# Patient Record
Sex: Female | Born: 2000 | Race: White | Hispanic: No | Marital: Single | Smoking: Never smoker
Health system: Southern US, Community
[De-identification: ages and names within clinical notes are randomized; demographics above are authoritative.]

## PROBLEM LIST (undated history)

## (undated) DIAGNOSIS — K219 Gastro-esophageal reflux disease without esophagitis: Secondary | ICD-10-CM

## (undated) DIAGNOSIS — Z789 Other specified health status: Secondary | ICD-10-CM

## (undated) HISTORY — DX: Other specified health status: Z78.9

## (undated) HISTORY — PX: KNEE SURGERY: SHX244

## (undated) HISTORY — PX: TONSILLECTOMY: SUR1361

---

## 2020-10-03 NOTE — L&D Delivery Note (Addendum)
Delivery Note:   G1P0 at [redacted]w[redacted]d  Admitting diagnosis: Labor and delivery indication for care or intervention [O75.9] Risks: GBS positive  Onset of labor: 0330 IOL/Augmentation: AROM ROM: 0330  Complete dilation at 09/25/2021  0524 Onset of pushing at 0529 FHR second stage 135  Analgesia /Anesthesia intrapartum:Epidural  Pushing in lithotomy position with CNM and L&D staff support, significant other Nasir and his mom present for birth and supportive. 2 contraction 2nd stage.  Delivery of a Live born female  Birth Weight:  3260g APGAR: 8, 9  Newborn Delivery   Birth date/time: 09/25/2021 05:33:00 Delivery type: Vaginal, Spontaneous      in cephalic presentation, position ROA. Gentle traction applied and after head delivered tight nuchal noted and not reducible. Shoulders delivered with ease. Delivered through nuchal and baby placed onto mom's chest.   APGAR:1 min-8 , 5 min-9   Nuchal Cord: Yes  Cord double clamped after cessation of pulsation, cut by father, Nasir.  Collection of cord blood for typing completed. Cord blood donation-None  Arterial cord blood sample-No    Placenta delivered-Spontaneous  with 3 vessels . Uterotonics: IV Pitocin Placenta to L&D Uterine tone firm  Bleeding light  None  laceration identified.  Episiotomy:None  Local analgesia: N/A  Repair: N/A Est. Blood Loss (mL):300.00   Complications: None  Mom to postpartum.  Ellender Hose to Couplet care / Skin to Skin.  Delivery Report:  Review the Delivery Report for details.    Trellis Moment, SNM 09/25/2021, 5:48 AM   The above was performed under my direct supervision and guidance.

## 2021-04-07 ENCOUNTER — Other Ambulatory Visit: Payer: Self-pay | Admitting: Obstetrics & Gynecology

## 2021-04-07 DIAGNOSIS — O3680X0 Pregnancy with inconclusive fetal viability, not applicable or unspecified: Secondary | ICD-10-CM

## 2021-04-08 ENCOUNTER — Other Ambulatory Visit: Payer: Self-pay

## 2021-04-08 ENCOUNTER — Other Ambulatory Visit: Payer: Self-pay | Admitting: Obstetrics & Gynecology

## 2021-04-08 ENCOUNTER — Ambulatory Visit (INDEPENDENT_AMBULATORY_CARE_PROVIDER_SITE_OTHER): Payer: Medicaid Other

## 2021-04-08 DIAGNOSIS — O3680X0 Pregnancy with inconclusive fetal viability, not applicable or unspecified: Secondary | ICD-10-CM

## 2021-04-08 DIAGNOSIS — Z3A15 15 weeks gestation of pregnancy: Secondary | ICD-10-CM | POA: Diagnosis not present

## 2021-04-08 NOTE — Progress Notes (Signed)
[place] 15+2 wks,single IUP,normal ovaries,anterior placenta gr 0,FHR 160 bpm,SVP of fluid 2.9 cm,efw 106 g 12.6%

## 2021-04-21 DIAGNOSIS — Z3400 Encounter for supervision of normal first pregnancy, unspecified trimester: Secondary | ICD-10-CM | POA: Insufficient documentation

## 2021-05-04 ENCOUNTER — Other Ambulatory Visit: Payer: Self-pay | Admitting: Advanced Practice Midwife

## 2021-05-04 DIAGNOSIS — Z363 Encounter for antenatal screening for malformations: Secondary | ICD-10-CM

## 2021-05-05 ENCOUNTER — Other Ambulatory Visit: Payer: Self-pay

## 2021-05-05 ENCOUNTER — Ambulatory Visit (INDEPENDENT_AMBULATORY_CARE_PROVIDER_SITE_OTHER): Payer: Medicaid Other | Admitting: Advanced Practice Midwife

## 2021-05-05 ENCOUNTER — Ambulatory Visit (INDEPENDENT_AMBULATORY_CARE_PROVIDER_SITE_OTHER): Payer: Medicaid Other

## 2021-05-05 ENCOUNTER — Other Ambulatory Visit (HOSPITAL_COMMUNITY)
Admission: RE | Admit: 2021-05-05 | Discharge: 2021-05-05 | Disposition: A | Discharge status: Home / Self Care | Payer: Medicaid Other | Source: Ambulatory Visit | Attending: Advanced Practice Midwife | Admitting: Advanced Practice Midwife

## 2021-05-05 ENCOUNTER — Encounter: Payer: Self-pay | Admitting: Advanced Practice Midwife

## 2021-05-05 VITALS — BP 108/6 | HR 89 | Ht 66.0 in | Wt 134.0 lb

## 2021-05-05 DIAGNOSIS — Z363 Encounter for antenatal screening for malformations: Secondary | ICD-10-CM

## 2021-05-05 DIAGNOSIS — Z3A19 19 weeks gestation of pregnancy: Secondary | ICD-10-CM

## 2021-05-05 DIAGNOSIS — Z3402 Encounter for supervision of normal first pregnancy, second trimester: Secondary | ICD-10-CM

## 2021-05-05 DIAGNOSIS — O0930 Supervision of pregnancy with insufficient antenatal care, unspecified trimester: Secondary | ICD-10-CM

## 2021-05-05 LAB — POCT URINALYSIS DIPSTICK OB
Blood, UA: NEGATIVE
Glucose, UA: NEGATIVE
Ketones, UA: NEGATIVE
Leukocytes, UA: NEGATIVE
Nitrite, UA: NEGATIVE
POC,PROTEIN,UA: NEGATIVE

## 2021-05-05 MED ORDER — BLOOD PRESSURE MONITOR MISC: 0 refills | Status: DC

## 2021-05-05 NOTE — Patient Instructions (Signed)
Meghan Mitchell, thank you for choosing our office today! We appreciate the opportunity to meet your healthcare needs. You may receive a short survey by mail, e-mail, or through [employer]. If you are happy with your care we would appreciate if you could take just a few minutes to complete the survey questions. We read all of your comments and take your feedback very seriously. Thank you again for choosing our office.  Center for [employer] [facility] Team at [facility]  [facility] at [facility] ([address] [place], [place] [address]) Entrance C, located off of [address] Free 24/7 valet parking   Nausea & Vomiting Have saltine crackers or pretzels by your bed and eat a few bites before you raise your head out of bed in the morning Eat small frequent meals throughout the day instead of large meals Drink plenty of fluids throughout the day to stay hydrated, just don't drink a lot of fluids with your meals.  This can make your stomach fill up faster making you feel sick Do not brush your teeth right after you eat Products with real ginger are good for nausea, like ginger ale and ginger hard candy Make sure it says made with real ginger! Sucking on sour candy like lemon heads is also good for nausea If your prenatal vitamins make you nauseated, take them at night so you will sleep through the nausea Sea Bands If you feel like you need medicine for the nausea & vomiting please let us know If you are unable to keep any fluids or food down please let us know   Constipation Drink plenty of fluid, preferably water, throughout the day Eat foods high in fiber such as fruits, vegetables, and grains Exercise, such as walking, is a good way to keep your bowels regular Drink warm fluids, especially warm prune juice, or decaf coffee Eat a 1/2 cup of real oatmeal (not instant), 1/2 cup applesauce, and 1/2-1 cup warm prune juice every day If needed, you may take Colace (docusate sodium) stool softener  once or twice a day to help keep the stool soft.  If you still are having problems with constipation, you may take Miralax once daily as needed to help keep your bowels regular.   Home Blood Pressure Monitoring for Patients   Your provider has recommended that you check your blood pressure (BP) at least once a week at home. If you do not have a blood pressure cuff at home, one will be provided for you. Contact your provider if you have not received your monitor within 1 week.   Helpful Tips for Accurate Home Blood Pressure Checks  Don't smoke, exercise, or drink caffeine 30 minutes before checking your BP Use the restroom before checking your BP (a full bladder can raise your pressure) Relax in a comfortable upright chair Feet on the ground Left arm resting comfortably on a flat surface at the level of your heart Legs uncrossed Back supported Sit quietly and don't talk Place the cuff on your bare arm Adjust snuggly, so that only two fingertips can fit between your skin and the top of the cuff Check 2 readings separated by at least one minute Keep a log of your BP readings For a visual, please reference this diagram: http://ccnc.care/bpdiagram  Provider Name: [facility]     Phone: [phone]  Zone 1: ALL CLEAR  Continue to monitor your symptoms:  BP reading is less than 140 (top number) or less than 90 (bottom  number)  No right upper stomach pain No headaches or seeing spots No feeling nauseated or throwing up No swelling in face and hands  Zone 2: CAUTION Call your doctor's office for any of the following:  BP reading is greater than 140 (top number) or greater than 90 (bottom number)  Stomach pain under your ribs in the middle or right side Headaches or seeing spots Feeling nauseated or throwing up Swelling in face and hands  Zone 3: EMERGENCY  Seek immediate medical care if you have any of the following:  BP reading is greater than160 (top number) or greater than  110 (bottom number) Severe headaches not improving with Tylenol Serious difficulty catching your breath Any worsening symptoms from Zone 2    First Trimester of Pregnancy The first trimester of pregnancy is from week 1 until the end of week 12 (months 1 through 3). A week after a sperm fertilizes an egg, the egg will implant on the wall of the uterus. This embryo will begin to develop into a baby. Genes from you and your partner are forming the baby. The female genes determine whether the baby is a boy or a girl. At 6-8 weeks, the eyes and face are formed, and the heartbeat can be seen on ultrasound. At the end of 12 weeks, all the baby's organs are formed.  Now that you are pregnant, you will want to do everything you can to have a healthy baby. Two of the most important things are to get good prenatal care and to follow your health care provider's instructions. Prenatal care is all the medical care you receive before the baby's birth. This care will help prevent, find, and treat any problems during the pregnancy and childbirth. BODY CHANGES Your body goes through many changes during pregnancy. The changes vary from woman to woman.  You may gain or lose a couple of pounds at first. You may feel sick to your stomach (nauseous) and throw up (vomit). If the vomiting is uncontrollable, call your health care provider. You may tire easily. You may develop headaches that can be relieved by medicines approved by your health care provider. You may urinate more often. Painful urination may mean you have a bladder infection. You may develop heartburn as a result of your pregnancy. You may develop constipation because certain hormones are causing the muscles that push waste through your intestines to slow down. You may develop hemorrhoids or swollen, bulging veins (varicose veins). Your breasts may begin to grow larger and become tender. Your nipples may stick out more, and the tissue that surrounds them  (areola) may become darker. Your gums may bleed and may be sensitive to brushing and flossing. Dark spots or blotches (chloasma, mask of pregnancy) may develop on your face. This will likely fade after the baby is born. Your menstrual periods will stop. You may have a loss of appetite. You may develop cravings for certain kinds of food. You may have changes in your emotions from day to day, such as being excited to be pregnant or being concerned that something may go wrong with the pregnancy and baby. You may have more vivid and strange dreams. You may have changes in your hair. These can include thickening of your hair, rapid growth, and changes in texture. Some women also have hair loss during or after pregnancy, or hair that feels dry or thin. Your hair will most likely return to normal after your baby is born. WHAT TO EXPECT AT YOUR PRENATAL  VISITS During a routine prenatal visit: You will be weighed to make sure you and the baby are growing normally. Your blood pressure will be taken. Your abdomen will be measured to track your baby's growth. The fetal heartbeat will be listened to starting around week 10 or 12 of your pregnancy. Test results from any previous visits will be discussed. Your health care provider may ask you: How you are feeling. If you are feeling the baby move. If you have had any abnormal symptoms, such as leaking fluid, bleeding, severe headaches, or abdominal cramping. If you have any questions. Other tests that may be performed during your first trimester include: Blood tests to find your blood type and to check for the presence of any previous infections. They will also be used to check for low iron levels (anemia) and Rh antibodies. Later in the pregnancy, blood tests for diabetes will be done along with other tests if problems develop. Urine tests to check for infections, diabetes, or protein in the urine. An ultrasound to confirm the proper growth and development  of the baby. An amniocentesis to check for possible genetic problems. Fetal screens for spina bifida and Down syndrome. You may need other tests to make sure you and the baby are doing well. HOME CARE INSTRUCTIONS  Medicines Follow your health care provider's instructions regarding medicine use. Specific medicines may be either safe or unsafe to take during pregnancy. Take your prenatal vitamins as directed. If you develop constipation, try taking a stool softener if your health care provider approves. Diet Eat regular, well-balanced meals. Choose a variety of foods, such as meat or vegetable-based protein, fish, milk and low-fat dairy products, vegetables, fruits, and whole grain breads and cereals. Your health care provider will help you determine the amount of weight gain that is right for you. Avoid raw meat and uncooked cheese. These carry germs that can cause birth defects in the baby. Eating four or five small meals rather than three large meals a day may help relieve nausea and vomiting. If you start to feel nauseous, eating a few soda crackers can be helpful. Drinking liquids between meals instead of during meals also seems to help nausea and vomiting. If you develop constipation, eat more high-fiber foods, such as fresh vegetables or fruit and whole grains. Drink enough fluids to keep your urine clear or pale yellow. Activity and Exercise Exercise only as directed by your health care provider. Exercising will help you: Control your weight. Stay in shape. Be prepared for labor and delivery. Experiencing pain or cramping in the lower abdomen or low back is a good sign that you should stop exercising. Check with your health care provider before continuing normal exercises. Try to avoid standing for long periods of time. Move your legs often if you must stand in one place for a long time. Avoid heavy lifting. Wear low-heeled shoes, and practice good posture. You may continue to have sex  unless your health care provider directs you otherwise. Relief of Pain or Discomfort Wear a good support bra for breast tenderness.   Take warm sitz baths to soothe any pain or discomfort caused by hemorrhoids. Use hemorrhoid cream if your health care provider approves.   Rest with your legs elevated if you have leg cramps or low back pain. If you develop varicose veins in your legs, wear support hose. Elevate your feet for 15 minutes, 3-4 times a day. Limit salt in your diet. Prenatal Care Schedule your prenatal visits by the  twelfth week of pregnancy. They are usually scheduled monthly at first, then more often in the last 2 months before delivery. Write down your questions. Take them to your prenatal visits. Keep all your prenatal visits as directed by your health care provider. Safety Wear your seat belt at all times when driving. Make a list of emergency phone numbers, including numbers for family, friends, the hospital, and police and fire departments. General Tips Ask your health care provider for a referral to a local prenatal education class. Begin classes no later than at the beginning of month 6 of your pregnancy. Ask for help if you have counseling or nutritional needs during pregnancy. Your health care provider can offer advice or refer you to specialists for help with various needs. Do not use hot tubs, steam rooms, or saunas. Do not douche or use tampons or scented sanitary pads. Do not cross your legs for long periods of time. Avoid cat litter boxes and soil used by cats. These carry germs that can cause birth defects in the baby and possibly loss of the fetus by miscarriage or stillbirth. Avoid all smoking, herbs, alcohol, and medicines not prescribed by your health care provider. Chemicals in these affect the formation and growth of the baby. Schedule a dentist appointment. At home, brush your teeth with a soft toothbrush and be gentle when you floss. SEEK MEDICAL CARE IF:   You have dizziness. You have mild pelvic cramps, pelvic pressure, or nagging pain in the abdominal area. You have persistent nausea, vomiting, or diarrhea. You have a bad smelling vaginal discharge. You have pain with urination. You notice increased swelling in your face, hands, legs, or ankles. SEEK IMMEDIATE MEDICAL CARE IF:  You have a fever. You are leaking fluid from your vagina. You have spotting or bleeding from your vagina. You have severe abdominal cramping or pain. You have rapid weight gain or loss. You vomit blood or material that looks like coffee grounds. You are exposed to [place] measles and have never had them. You are exposed to fifth disease or chickenpox. You develop a severe headache. You have shortness of breath. You have any kind of trauma, such as from a fall or a car accident. Document Released: 09/13/2001 Document Revised: 02/03/2014 Document Reviewed: 07/30/2013 [facility] Patient Information 2015 [place], [place]. This information is not intended to replace advice given to you by your health care provider. Make sure you discuss any questions you have with your health care provider.

## 2021-05-05 NOTE — Progress Notes (Signed)
[place] 19+1 wks,breech,cx 3.1 cm,anterior placenta gr 0,normal ovaries,svp of fluid 5.3 cm,fhr 154 bpm,EFW 244 g 16%,anatomy complete,no obvious abnormalities

## 2021-05-05 NOTE — Progress Notes (Signed)
INITIAL OBSTETRICAL VISIT Patient name: Meghan Mitchell MRN [id number]  Date of birth: 2001-03-11 Chief Complaint:   Initial Prenatal Visit  History of Present Illness:   Meghan Mitchell is a 20 y.o. G1P0 Caucasian female at [redacted]w[redacted]d by LMP c/w u/s at 15.2 weeks with an Estimated Date of Delivery: 09/28/21 being seen today for her initial obstetrical visit.   Patient's last menstrual period was 12/22/2020 (exact date). Her obstetrical history is significant for primigravida.   Today she reports no complaints.  Last pap never.   Depression screen [facility] 2/9 05/05/2021  Decreased Interest 0  Down, Depressed, Hopeless 0  PHQ - 2 Score 0  Altered sleeping 1  Tired, decreased energy 1  Change in appetite 1  Feeling bad or failure about yourself  0  Trouble concentrating 0  Moving slowly or fidgety/restless 0  Suicidal thoughts 0  PHQ-9 Score 3     GAD 7 : Generalized Anxiety Score 05/05/2021  Nervous, Anxious, on Edge 0  Control/stop worrying 0  Worry too much - different things 0  Trouble relaxing 0  Restless 0  Easily annoyed or irritable 0  Afraid - awful might happen 0  Total GAD 7 Score 0     Review of Systems:   Pertinent items are noted in HPI Denies cramping/contractions, leakage of fluid, vaginal bleeding, abnormal vaginal discharge w/ itching/odor/irritation, headaches, visual changes, shortness of breath, chest pain, abdominal pain, severe nausea/vomiting, or problems with urination or bowel movements unless otherwise stated above.  Pertinent History Reviewed:  Reviewed past medical,surgical, social, obstetrical and family history.  Reviewed problem list, medications and allergies. OB History  Gravida Para Term Preterm AB Living  1            SAB IAB Ectopic Multiple Live Births               # Outcome Date GA Lbr Len/2nd Weight Sex Delivery Anes PTL Lv  1 Current            Physical Assessment:   Vitals:   05/05/21 1056 05/05/21 1139  BP: (!) 108/6   Pulse:  89   Weight: 134 lb (60.8 kg)   Height:  5\' 6"  (1.676 m)  Body mass index is 21.63 kg/m.       Physical Examination:  General appearance - well appearing, and in no distress  Mental status - alert, oriented to person, place, and time  Psych:  She has a normal mood and affect  Skin - warm and dry, normal color, no suspicious lesions noted  Chest - effort normal, all lung fields clear to auscultation bilaterally  Heart - normal rate and regular rhythm  Abdomen - soft, nontender  Extremities:  No swelling or varicosities noted  Pelvic - VULVA: normal appearing vulva with no masses, tenderness or lesions  VAGINA: normal appearing vagina with normal color and discharge, no lesions  CERVIX: normal appearing cervix without discharge or lesions, no CMT  Thin prep pap is done without HR HPV cotesting  Chaperone:    TODAY'S ANATOMY U/S: Malachy Mood 19+1 wks,breech,cx 3.1 cm,anterior placenta gr 0,normal ovaries,svp of fluid 5.3 cm,fhr 154 bpm,EFW 244 g 16%,anatomy complete,no obvious abnormalities    No results found for this or any previous visit (from the past 24 hour(s)).  Assessment & Plan:  1) Low-Risk Pregnancy G1P0 at [redacted]w[redacted]d with an Estimated Date of Delivery: 09/28/21   2) Initial OB visit  3) Late onset of care at 19wks  Meds:  Meds ordered this encounter  Medications   Blood Pressure Monitor MISC    Sig: For regular home bp monitoring during pregnancy    Dispense:  1 each    Refill:  0    Z34.82 Please mail to patient    Initial labs obtained Continue prenatal vitamins Reviewed n/v relief measures and warning s/s to report Reviewed recommended weight gain based on pre-gravid BMI Encouraged well-balanced diet Genetic & carrier screening discussed: declines Panorama, AFP, and Horizon , too late for NT/IT Ultrasound discussed; fetal survey: requested CCNC completed> form faxed if has or is planning to apply for medicaid The nature of [employer] for [employer] with multiple MDs and other Advanced Practice Providers was explained to patient; also emphasized that fellows, residents, and students are part of our team. Does not have home bp cuff. Office bp cuff given: no. Rx sent: yes. Check bp weekly, let us know if consistently >140/90.   No indications for ASA therapy or early A1c (per uptodate)   Follow-up: Return in about 4 weeks (around 06/02/2021) for LROB, in person.   Orders Placed This Encounter  Procedures   Urine Culture   Pain Management Screening Profile (10S)   CBC/D/Plt+RPR+Rh+ABO+RubIgG...   Hgb Fractionation Cascade   POC Urinalysis Dipstick OB    Arabella Merles [facility] 05/05/2021 11:43 AM

## 2021-05-06 ENCOUNTER — Encounter: Payer: Self-pay | Admitting: Advanced Practice Midwife

## 2021-05-06 DIAGNOSIS — F1290 Cannabis use, unspecified, uncomplicated: Secondary | ICD-10-CM | POA: Insufficient documentation

## 2021-05-06 DIAGNOSIS — Z3482 Encounter for supervision of other normal pregnancy, second trimester: Secondary | ICD-10-CM | POA: Diagnosis not present

## 2021-05-06 LAB — PMP SCREEN PROFILE (10S), URINE
Amphetamine Scrn, Ur: NEGATIVE ng/mL
BARBITURATE SCREEN URINE: NEGATIVE ng/mL
BENZODIAZEPINE SCREEN, URINE: NEGATIVE ng/mL
CANNABINOIDS UR QL SCN: POSITIVE ng/mL — AB
Cocaine (Metab) Scrn, Ur: NEGATIVE ng/mL
Creatinine(Crt), U: 68.9 mg/dL (ref 20.0–300.0)
Methadone Screen, Urine: NEGATIVE ng/mL
OXYCODONE+OXYMORPHONE UR QL SCN: NEGATIVE ng/mL
Opiate Scrn, Ur: NEGATIVE ng/mL
Ph of Urine: 8.2 (ref 4.5–8.9)
Phencyclidine Qn, Ur: NEGATIVE ng/mL
Propoxyphene Scrn, Ur: NEGATIVE ng/mL

## 2021-05-08 LAB — CBC/D/PLT+RPR+RH+ABO+RUBIGG...
Antibody Screen: NEGATIVE
Basophils Absolute: 0 10*3/uL (ref 0.0–0.2)
Basos: 0 %
EOS (ABSOLUTE): 0.1 10*3/uL (ref 0.0–0.4)
Eos: 1 %
HCV Ab: 0.6 s/co ratio (ref 0.0–0.9)
HIV Screen 4th Generation wRfx: NONREACTIVE
Hematocrit: 38.2 % (ref 34.0–46.6)
Hemoglobin: 12.5 g/dL (ref 11.1–15.9)
Hepatitis B Surface Ag: NEGATIVE
Immature Grans (Abs): 0 10*3/uL (ref 0.0–0.1)
Immature Granulocytes: 0 %
Lymphocytes Absolute: 1.6 10*3/uL (ref 0.7–3.1)
Lymphs: 14 %
MCH: 27.5 pg (ref 26.6–33.0)
MCHC: 32.7 g/dL (ref 31.5–35.7)
MCV: 84 fL (ref 79–97)
Monocytes Absolute: 0.5 10*3/uL (ref 0.1–0.9)
Monocytes: 5 %
Neutrophils Absolute: 8.9 10*3/uL — ABNORMAL HIGH (ref 1.4–7.0)
Neutrophils: 80 %
Platelets: 302 10*3/uL (ref 150–450)
RBC: 4.54 x10E6/uL (ref 3.77–5.28)
RDW: 13.3 % (ref 11.7–15.4)
RPR Ser Ql: NONREACTIVE
Rh Factor: POSITIVE
Rubella Antibodies, IGG: 1.15 index (ref >0.99)
WBC: 11.1 10*3/uL — ABNORMAL HIGH (ref 3.4–10.8)

## 2021-05-08 LAB — HGB FRACTIONATION CASCADE
Hgb A2: 2.7 % (ref 1.8–3.2)
Hgb A: 97.3 % (ref 96.4–98.8)
Hgb F: 0 % (ref 0.0–2.0)
Hgb S: 0 %

## 2021-05-08 LAB — HCV INTERPRETATION

## 2021-05-08 LAB — URINE CULTURE

## 2021-05-11 LAB — CYTOLOGY - PAP
Chlamydia: NEGATIVE
Comment: NEGATIVE
Comment: NEGATIVE
Comment: NORMAL
Diagnosis: UNDETERMINED — AB
High risk HPV: NEGATIVE
Neisseria Gonorrhea: NEGATIVE

## 2021-05-14 ENCOUNTER — Encounter: Payer: Self-pay | Admitting: Advanced Practice Midwife

## 2021-05-14 ENCOUNTER — Other Ambulatory Visit: Payer: Self-pay | Admitting: Advanced Practice Midwife

## 2021-05-14 DIAGNOSIS — R8271 Bacteriuria: Secondary | ICD-10-CM | POA: Insufficient documentation

## 2021-05-14 MED ORDER — PENICILLIN V POTASSIUM 500 MG PO TABS: 500.0000 mg | ORAL_TABLET | Freq: Four times a day (QID) | ORAL | 0 refills | Status: DC

## 2021-06-02 ENCOUNTER — Encounter: Payer: Self-pay | Admitting: Advanced Practice Midwife

## 2021-06-02 ENCOUNTER — Other Ambulatory Visit (HOSPITAL_COMMUNITY)
Admission: RE | Admit: 2021-06-02 | Discharge: 2021-06-02 | Disposition: A | Discharge status: Home / Self Care | Payer: Medicaid Other | Source: Ambulatory Visit | Attending: Women's Health | Admitting: Women's Health

## 2021-06-02 ENCOUNTER — Ambulatory Visit (INDEPENDENT_AMBULATORY_CARE_PROVIDER_SITE_OTHER): Payer: Medicaid Other | Admitting: Advanced Practice Midwife

## 2021-06-02 ENCOUNTER — Other Ambulatory Visit: Payer: Self-pay

## 2021-06-02 VITALS — BP 119/67 | HR 89 | Wt 140.0 lb

## 2021-06-02 DIAGNOSIS — Z3A23 23 weeks gestation of pregnancy: Secondary | ICD-10-CM | POA: Insufficient documentation

## 2021-06-02 DIAGNOSIS — Z3402 Encounter for supervision of normal first pregnancy, second trimester: Secondary | ICD-10-CM | POA: Diagnosis not present

## 2021-06-02 DIAGNOSIS — O26892 Other specified pregnancy related conditions, second trimester: Secondary | ICD-10-CM | POA: Insufficient documentation

## 2021-06-02 DIAGNOSIS — N898 Other specified noninflammatory disorders of vagina: Secondary | ICD-10-CM | POA: Insufficient documentation

## 2021-06-02 DIAGNOSIS — R8271 Bacteriuria: Secondary | ICD-10-CM

## 2021-06-02 NOTE — Progress Notes (Signed)
   LOW-RISK PREGNANCY VISIT Patient name: Meghan Mitchell MRN [id number]  Date of birth: 2000/11/02 Chief Complaint:   Routine Prenatal Visit (Yellow discharge)  History of Present Illness:   Meghan Mitchell is a 20 y.o. G1P0 female at [redacted]w[redacted]d with an Estimated Date of Delivery: 09/28/21 being seen today for ongoing management of a low-risk pregnancy.  Today she reports  yellow vag d/c- no symptoms; was able to finish PenVK; doing well . Contractions: Not present. Vag. Bleeding: None.  Movement: Present. denies leaking of fluid. Review of Systems:   Pertinent items are noted in HPI Denies abnormal vaginal discharge w/ itching/odor/irritation, headaches, visual changes, shortness of breath, chest pain, abdominal pain, severe nausea/vomiting, or problems with urination or bowel movements unless otherwise stated above. Pertinent History Reviewed:  Reviewed past medical,surgical, social, obstetrical and family history.  Reviewed problem list, medications and allergies. Physical Assessment:   Vitals:   06/02/21 1059  BP: 119/67  Pulse: 89  Weight: 140 lb (63.5 kg)  Body mass index is 22.6 kg/m.        Physical Examination:   General appearance: Well appearing, and in no distress  Mental status: Alert, oriented to person, place, and time  Skin: Warm & dry  Cardiovascular: Normal heart rate noted  Respiratory: Normal respiratory effort, no distress  Abdomen: Soft, gravid, nontender  Pelvic: Cervical exam deferred         Extremities: Edema: None  Fetal Status: Fetal Heart Rate (bpm): 161   Movement: Present    No results found for this or any previous visit (from the past 24 hour(s)).  Assessment & Plan:  1) Low-risk pregnancy G1P0 at [redacted]w[redacted]d with an Estimated Date of Delivery: 09/28/21   2) GBS bacteriuria, finished Pen VK, POC today; ppx in labor  3) Vag d/c, CV sent for BV, yeast, trich   Meds: No orders of the defined types were placed in this encounter.  Labs/procedures today:  CV, urine culture  Plan:  Continue routine obstetrical care   Reviewed: Preterm labor symptoms and general obstetric precautions including but not limited to vaginal bleeding, contractions, leaking of fluid and fetal movement were reviewed in detail with the patient.  All questions were answered. Didn't ask about home bp cuff.  Check bp weekly, let us know if >140/90.   Follow-up: Return in about 4 weeks (around 06/30/2021) for LROB, PN2, in person.  Orders Placed This Encounter  Procedures   Urine Culture   Arabella Merles CNM 06/02/2021 11:20 AM

## 2021-06-02 NOTE — Addendum Note (Signed)
Addended by: Moss Mc on: 06/02/2021 11:48 AM   Modules accepted: Orders

## 2021-06-02 NOTE — Patient Instructions (Signed)
Meghan Mitchell, I greatly value your feedback.  If you receive a survey following your visit with [place] today, we appreciate you taking the time to fill it out.  Thanks, Philipp Deputy, CNM   You will have your sugar test next visit.  Please do not eat or drink anything after midnight the night before you come, not even water.  You will be here for at least two hours.  Please make an appointment online for the bloodwork at [email or web address] for 8:30am (or as close to this as possible). Make sure you select the [facility] service center. The day of the appointment, check in with our office first, then you will go to [facility] to start the sugar test.    [facility] HAS MOVED!!! It is now [facility] ([address] [place], [place] [address]) Entrance C, located off of [address] valet parking  Go to [email or web address] to register for FREE online childbirth classes   Call the office [phone]) or go to [facility] if: You begin to have strong, frequent contractions Your water breaks.  Sometimes it is a big gush of fluid, sometimes it is just a trickle that keeps getting your panties wet or running down your legs You have vaginal bleeding.  It is normal to have a small amount of spotting if your cervix was checked.  You don't feel your baby moving like normal.  If you don't, get you something to eat and drink and lay down and focus on feeling your baby move.   If your baby is still not moving like normal, you should call the office or go to [facility].  [place] Pediatricians/Family Doctors: [facility] [phone]           [facility] [phone]                [facility] [phone] (usually not accepting new patients unless you have family there already, you are always welcome to call and ask)      [facility] Department [phone]       [facility] Pediatricians/Family Doctors:  [facility]: [phone] [facility]: [phone] [facility]: [phone]  [facility] Doctors:  [facility]: [phone]  [facility]: [phone]  [facility] Doctors: [facility]: [phone]   Home Blood Pressure Monitoring for Patients   Your provider has recommended that you check your blood pressure (BP) at least once a week at home. If you do not have a blood pressure cuff at home, one will be provided for you. Contact your provider if you have not received your monitor within 1 week.   Helpful Tips for Accurate Home Blood Pressure Checks  Don't smoke, exercise, or drink caffeine 30 minutes before checking your BP Use the restroom before checking your BP (a full bladder can raise your pressure) Relax in a comfortable upright chair Feet on the ground Left arm resting comfortably on a flat surface at the level of your heart Legs uncrossed Back supported Sit quietly and don't talk Place the cuff on your bare arm Adjust snuggly, so that only two fingertips can fit between your skin and the top of the cuff Check 2 readings separated by at least one minute Keep a log of your BP readings For a visual, please reference this diagram: http://ccnc.care/bpdiagram  Provider Name: [facility]     Phone: [phone]  Zone 1: ALL CLEAR  Continue to monitor your symptoms:  BP reading is less than 140 (top number) or less than 90 (bottom number)  No right upper stomach pain No headaches or seeing spots No feeling nauseated or throwing up No swelling in face and hands  Zone 2: CAUTION Call your doctor's office for any of the following:  BP reading is greater than 140 (top number) or greater than 90 (bottom number)  Stomach pain under your ribs in the middle or right side Headaches or seeing spots Feeling nauseated or throwing up Swelling in face and hands  Zone 3: EMERGENCY  Seek  immediate medical care if you have any of the following:  BP reading is greater than160 (top number) or greater than 110 (bottom number) Severe headaches not improving with Tylenol Serious difficulty catching your breath Any worsening symptoms from Zone 2   Second Trimester of Pregnancy The second trimester is from week 13 through week 28, months 4 through 6. The second trimester is often a time when you feel your best. Your body has also adjusted to being pregnant, and you begin to feel better physically. Usually, morning sickness has lessened or quit completely, you may have more energy, and you may have an increase in appetite. The second trimester is also a time when the fetus is growing rapidly. At the end of the sixth month, the fetus is about 9 inches long and weighs about 1 pounds. You will likely begin to feel the baby move (quickening) between 18 and 20 weeks of the pregnancy. BODY CHANGES Your body goes through many changes during pregnancy. The changes vary from woman to woman.  Your weight will continue to increase. You will notice your lower abdomen bulging out. You may begin to get stretch marks on your hips, abdomen, and breasts. You may develop headaches that can be relieved by medicines approved by your health care provider. You may urinate more often because the fetus is pressing on your bladder. You may develop or continue to have heartburn as a result of your pregnancy. You may develop constipation because certain hormones are causing the muscles that push waste through your intestines to slow down. You may develop hemorrhoids or swollen, bulging veins (varicose veins). You may have back pain because of the weight gain and pregnancy hormones relaxing your joints between the bones in your pelvis and as a result of a shift in weight and the muscles that support your balance. Your breasts will continue to grow and be tender. Your gums may bleed and may be sensitive to brushing  and flossing. Dark spots or blotches (chloasma, mask of pregnancy) may develop on your face. This will likely fade after the baby is born. A dark line from your belly button to the pubic area (linea nigra) may appear. This will likely fade after the baby is born. You may have changes in your hair. These can include thickening of your hair, rapid growth, and changes in texture. Some women also have hair loss during or after pregnancy, or hair that feels dry or thin. Your hair will most likely return to normal after your baby is born. WHAT TO EXPECT AT YOUR PRENATAL VISITS During a routine prenatal visit: You will be weighed to make sure you and the fetus are growing normally. Your blood pressure will be taken. Your abdomen will be measured to track your baby's growth. The fetal heartbeat will be listened to. Any test results from the previous visit will be discussed. Your health care provider  may ask you: How you are feeling. If you are feeling the baby move. If you have had any abnormal symptoms, such as leaking fluid, bleeding, severe headaches, or abdominal cramping. If you have any questions. Other tests that may be performed during your second trimester include: Blood tests that check for: Low iron levels (anemia). Gestational diabetes (between 24 and 28 weeks). Rh antibodies. Urine tests to check for infections, diabetes, or protein in the urine. An ultrasound to confirm the proper growth and development of the baby. An amniocentesis to check for possible genetic problems. Fetal screens for spina bifida and Down syndrome. HOME CARE INSTRUCTIONS  Avoid all smoking, herbs, alcohol, and unprescribed drugs. These chemicals affect the formation and growth of the baby. Follow your health care provider's instructions regarding medicine use. There are medicines that are either safe or unsafe to take during pregnancy. Exercise only as directed by your health care provider. Experiencing  uterine cramps is a good sign to stop exercising. Continue to eat regular, healthy meals. Wear a good support bra for breast tenderness. Do not use hot tubs, steam rooms, or saunas. Wear your seat belt at all times when driving. Avoid raw meat, uncooked cheese, cat litter boxes, and soil used by cats. These carry germs that can cause birth defects in the baby. Take your prenatal vitamins. Try taking a stool softener (if your health care provider approves) if you develop constipation. Eat more high-fiber foods, such as fresh vegetables or fruit and whole grains. Drink plenty of fluids to keep your urine clear or pale yellow. Take warm sitz baths to soothe any pain or discomfort caused by hemorrhoids. Use hemorrhoid cream if your health care provider approves. If you develop varicose veins, wear support hose. Elevate your feet for 15 minutes, 3-4 times a day. Limit salt in your diet. Avoid heavy lifting, wear low heel shoes, and practice good posture. Rest with your legs elevated if you have leg cramps or low back pain. Visit your dentist if you have not gone yet during your pregnancy. Use a soft toothbrush to brush your teeth and be gentle when you floss. A sexual relationship may be continued unless your health care provider directs you otherwise. Continue to go to all your prenatal visits as directed by your health care provider. SEEK MEDICAL CARE IF:  You have dizziness. You have mild pelvic cramps, pelvic pressure, or nagging pain in the abdominal area. You have persistent nausea, vomiting, or diarrhea. You have a bad smelling vaginal discharge. You have pain with urination. SEEK IMMEDIATE MEDICAL CARE IF:  You have a fever. You are leaking fluid from your vagina. You have spotting or bleeding from your vagina. You have severe abdominal cramping or pain. You have rapid weight gain or loss. You have shortness of breath with chest pain. You notice sudden or extreme swelling of your face,  hands, ankles, feet, or legs. You have not felt your baby move in over an hour. You have severe headaches that do not go away with medicine. You have vision changes. Document Released: 09/13/2001 Document Revised: 09/24/2013 Document Reviewed: 11/20/2012 [facility] Patient Information 2015 [place], [place]. This information is not intended to replace advice given to you by your health care provider. Make sure you discuss any questions you have with your health care provider.

## 2021-06-03 LAB — CERVICOVAGINAL ANCILLARY ONLY
Bacterial Vaginitis (gardnerella): POSITIVE — AB
Candida Glabrata: NEGATIVE
Candida Vaginitis: NEGATIVE
Comment: NEGATIVE
Comment: NEGATIVE
Comment: NEGATIVE
Comment: NEGATIVE
Trichomonas: NEGATIVE

## 2021-06-04 ENCOUNTER — Other Ambulatory Visit: Payer: Self-pay | Admitting: Advanced Practice Midwife

## 2021-06-04 MED ORDER — METRONIDAZOLE 500 MG PO TABS
500.0000 mg | ORAL_TABLET | Freq: Two times a day (BID) | ORAL | 0 refills | Status: DC
Start: 2021-06-04 — End: 2021-07-01

## 2021-06-05 LAB — URINE CULTURE

## 2021-07-01 ENCOUNTER — Other Ambulatory Visit: Payer: Self-pay

## 2021-07-01 ENCOUNTER — Ambulatory Visit (INDEPENDENT_AMBULATORY_CARE_PROVIDER_SITE_OTHER): Payer: Medicaid Other | Admitting: Women's Health

## 2021-07-01 ENCOUNTER — Encounter: Payer: Self-pay | Admitting: Women's Health

## 2021-07-01 ENCOUNTER — Other Ambulatory Visit: Payer: Medicaid Other

## 2021-07-01 VITALS — BP 113/57 | HR 82 | Wt 148.0 lb

## 2021-07-01 DIAGNOSIS — Z3A27 27 weeks gestation of pregnancy: Secondary | ICD-10-CM

## 2021-07-01 DIAGNOSIS — Z3402 Encounter for supervision of normal first pregnancy, second trimester: Secondary | ICD-10-CM

## 2021-07-01 NOTE — Patient Instructions (Signed)
Andreya, thank you for choosing our office today! We appreciate the opportunity to meet your healthcare needs. You may receive a short survey by mail, e-mail, or through [employer]. If you are happy with your care we would appreciate if you could take just a few minutes to complete the survey questions. We read all of your comments and take your feedback very seriously. Thank you again for choosing our office.  Center for [employer] Team at [facility]  [facility] at [facility] ([address] [place], [place] [address]) Entrance C, located off of [address] Free 24/7 valet parking   CLASSES: Go to [email or web address] to register for classes (childbirth, breastfeeding, waterbirth, infant CPR, daddy bootcamp, etc.)  Call the office [phone]) or go to [facility] ([facility]) if: You begin to have strong, frequent contractions Your water breaks.  Sometimes it is a big gush of fluid, sometimes it is just a trickle that keeps getting your panties wet or running down your legs You have vaginal bleeding.  It is normal to have a small amount of spotting if your cervix was checked.  You don't feel your baby moving like normal.  If you don't, get you something to eat and drink and lay down and focus on feeling your baby move.   If your baby is still not moving like normal, you should call the office or go to [facility].  Call the office [phone]) or go to [facility] hospital for these signs of pre-eclampsia: Severe headache that does not go away with Tylenol Visual changes- seeing spots, double, blurred vision Pain under your right breast or upper abdomen that does not go away with Tums or heartburn medicine Nausea and/or vomiting Severe swelling in your hands, feet, and face   Tdap Vaccine It is recommended that you get the Tdap vaccine during the third trimester of EACH pregnancy to help protect your baby from getting pertussis (whooping cough) 27-36 weeks is the BEST time to do  this so that you can pass the protection on to your baby. During pregnancy is better than after pregnancy, but if you are unable to get it during pregnancy it will be offered at the hospital.  You can get this vaccine with [place], at the health department, your family doctor, or some local pharmacies Everyone who will be around your baby should also be up-to-date on their vaccines before the baby comes. Adults (who are not pregnant) only need 1 dose of Tdap during adulthood.   [facility] Pediatricians/Family Doctors [facility]): [address] Colette Ribas, [phone]           [facility]: [address], [phone]                [facility]): [address], [phone] (call to ask if accepting patients) [facility] Department: [address], [place], [phone]    [facility] Pediatricians/Family Doctors [facility]): [phone] [address], [phone] [facility]: [address] [place], [phone] [facility]: [address], [phone]  [facility] Doctors  [facility]): [phone] [facility]: [address] [phone]   [facility] Doctors [facility]: [address], [phone]  [facility] Family Doctors  [facility]: [phone], [phone]  Home Blood Pressure Monitoring for Patients   Your provider has recommended that you check your  blood pressure (BP) at least once a week at home. If you do not have a blood pressure cuff at home, one will be provided for you. Contact your provider if you have not received your monitor within 1 week.   Helpful Tips for Accurate Home Blood Pressure Checks  Don't smoke, exercise, or drink caffeine 30 minutes before checking your BP Use the restroom before checking your BP (a full bladder can raise your  pressure) Relax in a comfortable upright chair Feet on the ground Left arm resting comfortably on a flat surface at the level of your heart Legs uncrossed Back supported Sit quietly and don't talk Place the cuff on your bare arm Adjust snuggly, so that only two fingertips can fit between your skin and the top of the cuff Check 2 readings separated by at least one minute Keep a log of your BP readings For a visual, please reference this diagram: http://ccnc.care/bpdiagram  Provider Name: [facility]     Phone: [phone]  Zone 1: ALL CLEAR  Continue to monitor your symptoms:  BP reading is less than 140 (top number) or less than 90 (bottom number)  No right upper stomach pain No headaches or seeing spots No feeling nauseated or throwing up No swelling in face and hands  Zone 2: CAUTION Call your doctor's office for any of the following:  BP reading is greater than 140 (top number) or greater than 90 (bottom number)  Stomach pain under your ribs in the middle or right side Headaches or seeing spots Feeling nauseated or throwing up Swelling in face and hands  Zone 3: EMERGENCY  Seek immediate medical care if you have any of the following:  BP reading is greater than160 (top number) or greater than 110 (bottom number) Severe headaches not improving with Tylenol Serious difficulty catching your breath Any worsening symptoms from Zone 2   Third Trimester of Pregnancy The third trimester is from week 29 through week 42, months 7 through 9. The third trimester is a time when the fetus is growing rapidly. At the end of the ninth month, the fetus is about 20 inches in length and weighs 6-10 pounds.  BODY CHANGES Your body goes through many changes during pregnancy. The changes vary from woman to woman.  Your weight will continue to increase. You can expect to gain 25-35 pounds (11-16 kg) by the end of the pregnancy. You may begin to get stretch marks on your hips, abdomen,  and breasts. You may urinate more often because the fetus is moving lower into your pelvis and pressing on your bladder. You may develop or continue to have heartburn as a result of your pregnancy. You may develop constipation because certain hormones are causing the muscles that push waste through your intestines to slow down. You may develop hemorrhoids or swollen, bulging veins (varicose veins). You may have pelvic pain because of the weight gain and pregnancy hormones relaxing your joints between the bones in your pelvis. Backaches may result from overexertion of the muscles supporting your posture. You may have changes in your hair. These can include thickening of your hair, rapid growth, and changes in texture. Some women also have hair loss during or after pregnancy, or hair that feels dry or thin. Your hair will most likely return to normal after your baby is born. Your breasts will continue to grow and be tender. A yellow discharge may leak from your breasts called colostrum. Your belly button may stick out. You may   feel short of breath because of your expanding uterus. You may notice the fetus "dropping," or moving lower in your abdomen. You may have a bloody mucus discharge. This usually occurs a few days to a week before labor begins. Your cervix becomes thin and soft (effaced) near your due date. WHAT TO EXPECT AT YOUR PRENATAL EXAMS  You will have prenatal exams every 2 weeks until week 36. Then, you will have weekly prenatal exams. During a routine prenatal visit: You will be weighed to make sure you and the fetus are growing normally. Your blood pressure is taken. Your abdomen will be measured to track your baby's growth. The fetal heartbeat will be listened to. Any test results from the previous visit will be discussed. You may have a cervical check near your due date to see if you have effaced. At around 36 weeks, your caregiver will check your cervix. At the same time, your  caregiver will also perform a test on the secretions of the vaginal tissue. This test is to determine if a type of bacteria, Group B streptococcus, is present. Your caregiver will explain this further. Your caregiver may ask you: What your birth plan is. How you are feeling. If you are feeling the baby move. If you have had any abnormal symptoms, such as leaking fluid, bleeding, severe headaches, or abdominal cramping. If you have any questions. Other tests or screenings that may be performed during your third trimester include: Blood tests that check for low iron levels (anemia). Fetal testing to check the health, activity level, and growth of the fetus. Testing is done if you have certain medical conditions or if there are problems during the pregnancy. FALSE LABOR You may feel small, irregular contractions that eventually go away. These are called Braxton Hicks contractions, or false labor. Contractions may last for hours, days, or even weeks before true labor sets in. If contractions come at regular intervals, intensify, or become painful, it is best to be seen by your caregiver.  SIGNS OF LABOR  Menstrual-like cramps. Contractions that are 5 minutes apart or less. Contractions that start on the top of the uterus and spread down to the lower abdomen and back. A sense of increased pelvic pressure or back pain. A watery or bloody mucus discharge that comes from the vagina. If you have any of these signs before the 37th week of pregnancy, call your caregiver right away. You need to go to the hospital to get checked immediately. HOME CARE INSTRUCTIONS  Avoid all smoking, herbs, alcohol, and unprescribed drugs. These chemicals affect the formation and growth of the baby. Follow your caregiver's instructions regarding medicine use. There are medicines that are either safe or unsafe to take during pregnancy. Exercise only as directed by your caregiver. Experiencing uterine cramps is a good sign to  stop exercising. Continue to eat regular, healthy meals. Wear a good support bra for breast tenderness. Do not use hot tubs, steam rooms, or saunas. Wear your seat belt at all times when driving. Avoid raw meat, uncooked cheese, cat litter boxes, and soil used by cats. These carry germs that can cause birth defects in the baby. Take your prenatal vitamins. Try taking a stool softener (if your caregiver approves) if you develop constipation. Eat more high-fiber foods, such as fresh vegetables or fruit and whole grains. Drink plenty of fluids to keep your urine clear or pale yellow. Take warm sitz baths to soothe any pain or discomfort caused by hemorrhoids. Use hemorrhoid cream if   your caregiver approves. If you develop varicose veins, wear support hose. Elevate your feet for 15 minutes, 3-4 times a day. Limit salt in your diet. Avoid heavy lifting, wear low heal shoes, and practice good posture. Rest a lot with your legs elevated if you have leg cramps or low back pain. Visit your dentist if you have not gone during your pregnancy. Use a soft toothbrush to brush your teeth and be gentle when you floss. A sexual relationship may be continued unless your caregiver directs you otherwise. Do not travel far distances unless it is absolutely necessary and only with the approval of your caregiver. Take prenatal classes to understand, practice, and ask questions about the labor and delivery. Make a trial run to the hospital. Pack your hospital bag. Prepare the baby's nursery. Continue to go to all your prenatal visits as directed by your caregiver. SEEK MEDICAL CARE IF: You are unsure if you are in labor or if your water has broken. You have dizziness. You have mild pelvic cramps, pelvic pressure, or nagging pain in your abdominal area. You have persistent nausea, vomiting, or diarrhea. You have a bad smelling vaginal discharge. You have pain with urination. SEEK IMMEDIATE MEDICAL CARE IF:  You  have a fever. You are leaking fluid from your vagina. You have spotting or bleeding from your vagina. You have severe abdominal cramping or pain. You have rapid weight loss or gain. You have shortness of breath with chest pain. You notice sudden or extreme swelling of your face, hands, ankles, feet, or legs. You have not felt your baby move in over an hour. You have severe headaches that do not go away with medicine. You have vision changes. Document Released: 09/13/2001 Document Revised: 09/24/2013 Document Reviewed: 11/20/2012 [employer] Patient Information 2015 [employer]. This information is not intended to replace advice given to you by your health care provider. Make sure you discuss any questions you have with your health care provider.       

## 2021-07-01 NOTE — Progress Notes (Signed)
    LOW-RISK PREGNANCY VISIT Patient name: Meghan Mitchell MRN [id number]  Date of birth: 25-Dec-2000 Chief Complaint:   Routine Prenatal Visit  History of Present Illness:   Meghan Mitchell is a 20 y.o. G1P0 female at [redacted]w[redacted]d with an Estimated Date of Delivery: 09/28/21 being seen today for ongoing management of a low-risk pregnancy.   Today she reports no complaints. Contractions: Not present. Vag. Bleeding: None.  Movement: Present. denies leaking of fluid.  Depression screen [facility] 2/9 07/01/2021 05/05/2021  Decreased Interest 0 0  Down, Depressed, Hopeless 0 0  PHQ - 2 Score 0 0  Altered sleeping 0 1  Tired, decreased energy 0 1  Change in appetite 0 1  Feeling bad or failure about yourself  0 0  Trouble concentrating 0 0  Moving slowly or fidgety/restless 0 0  Suicidal thoughts 0 0  PHQ-9 Score 0 3     GAD 7 : Generalized Anxiety Score 07/01/2021 05/05/2021  Nervous, Anxious, on Edge 0 0  Control/stop worrying 0 0  Worry too much - different things 0 0  Trouble relaxing 0 0  Restless 0 0  Easily annoyed or irritable 0 0  Afraid - awful might happen 0 0  Total GAD 7 Score 0 0      Review of Systems:   Pertinent items are noted in HPI Denies abnormal vaginal discharge w/ itching/odor/irritation, headaches, visual changes, shortness of breath, chest pain, abdominal pain, severe nausea/vomiting, or problems with urination or bowel movements unless otherwise stated above. Pertinent History Reviewed:  Reviewed past medical,surgical, social, obstetrical and family history.  Reviewed problem list, medications and allergies. Physical Assessment:   Vitals:   07/01/21 0857  BP: (!) 113/57  Pulse: 82  Weight: 148 lb (67.1 kg)  Body mass index is 23.89 kg/m.        Physical Examination:   General appearance: Well appearing, and in no distress  Mental status: Alert, oriented to person, place, and time  Skin: Warm & dry  Cardiovascular: Normal heart rate noted  Respiratory: Normal  respiratory effort, no distress  Abdomen: Soft, gravid, nontender  Pelvic: Cervical exam deferred         Extremities: Edema: None  Fetal Status: Fetal Heart Rate (bpm): 150 Fundal Height: 26 cm Movement: Present    Chaperone: N/A   No results found for this or any previous visit (from the past 24 hour(s)).  Assessment & Plan:  1) Low-risk pregnancy G1P0 at [redacted]w[redacted]d with an Estimated Date of Delivery: 09/28/21     Meds: No orders of the defined types were placed in this encounter.  Labs/procedures today: PN2, declined flu shot, and declines tdap today, plans next visit  Plan:  Continue routine obstetrical care  Next visit: prefers in person    Reviewed: Preterm labor symptoms and general obstetric precautions including but not limited to vaginal bleeding, contractions, leaking of fluid and fetal movement were reviewed in detail with the patient.  All questions were answered. Does have home bp cuff. Office bp cuff given: not applicable. Check bp weekly, let us know if consistently >140 and/or >90.  Follow-up: Return in about 4 weeks (around 07/29/2021) for LROB, CNM, in person.  No future appointments.  No orders of the defined types were placed in this encounter.  Cheral Marker CNM, [facility] 07/01/2021 9:22 AM

## 2021-07-02 LAB — GLUCOSE TOLERANCE, 2 HOURS W/ 1HR
Glucose, 1 hour: 91 mg/dL (ref 65–179)
Glucose, 2 hour: 70 mg/dL (ref 65–152)
Glucose, Fasting: 76 mg/dL (ref 65–91)

## 2021-07-02 LAB — CBC
Hematocrit: 34.8 % (ref 34.0–46.6)
Hemoglobin: 11.8 g/dL (ref 11.1–15.9)
MCH: 27.4 pg (ref 26.6–33.0)
MCHC: 33.9 g/dL (ref 31.5–35.7)
MCV: 81 fL (ref 79–97)
Platelets: 305 10*3/uL (ref 150–450)
RBC: 4.3 x10E6/uL (ref 3.77–5.28)
RDW: 12.1 % (ref 11.7–15.4)
WBC: 11.9 10*3/uL — ABNORMAL HIGH (ref 3.4–10.8)

## 2021-07-02 LAB — RPR: RPR Ser Ql: NONREACTIVE

## 2021-07-02 LAB — HIV ANTIBODY (ROUTINE TESTING W REFLEX): HIV Screen 4th Generation wRfx: NONREACTIVE

## 2021-07-02 LAB — ANTIBODY SCREEN: Antibody Screen: NEGATIVE

## 2021-07-29 ENCOUNTER — Other Ambulatory Visit: Payer: Self-pay

## 2021-07-29 ENCOUNTER — Encounter: Payer: Self-pay | Admitting: Women's Health

## 2021-07-29 ENCOUNTER — Ambulatory Visit (INDEPENDENT_AMBULATORY_CARE_PROVIDER_SITE_OTHER): Payer: Medicaid Other | Admitting: Women's Health

## 2021-07-29 VITALS — BP 112/68 | HR 89 | Wt 155.5 lb

## 2021-07-29 DIAGNOSIS — Z3403 Encounter for supervision of normal first pregnancy, third trimester: Secondary | ICD-10-CM | POA: Diagnosis not present

## 2021-07-29 DIAGNOSIS — Z23 Encounter for immunization: Secondary | ICD-10-CM | POA: Diagnosis not present

## 2021-07-29 DIAGNOSIS — Z3A31 31 weeks gestation of pregnancy: Secondary | ICD-10-CM

## 2021-07-29 NOTE — Progress Notes (Signed)
    LOW-RISK PREGNANCY VISIT Patient name: Meghan Mitchell MRN [id number]  Date of birth: July 13, 2001 Chief Complaint:   Routine Prenatal Visit  History of Present Illness:   Meghan Mitchell is a 20 y.o. G1P0 female at [redacted]w[redacted]d with an Estimated Date of Delivery: 09/28/21 being seen today for ongoing management of a low-risk pregnancy.   Today she reports no complaints. Goes to [employer] next Friday.  Contractions: Not present. Vag. Bleeding: None.  Movement: Present. denies leaking of fluid.  Depression screen [facility] 2/9 07/01/2021 05/05/2021  Decreased Interest 0 0  Down, Depressed, Hopeless 0 0  PHQ - 2 Score 0 0  Altered sleeping 0 1  Tired, decreased energy 0 1  Change in appetite 0 1  Feeling bad or failure about yourself  0 0  Trouble concentrating 0 0  Moving slowly or fidgety/restless 0 0  Suicidal thoughts 0 0  PHQ-9 Score 0 3     GAD 7 : Generalized Anxiety Score 07/01/2021 05/05/2021  Nervous, Anxious, on Edge 0 0  Control/stop worrying 0 0  Worry too much - different things 0 0  Trouble relaxing 0 0  Restless 0 0  Easily annoyed or irritable 0 0  Afraid - awful might happen 0 0  Total GAD 7 Score 0 0      Review of Systems:   Pertinent items are noted in HPI Denies abnormal vaginal discharge w/ itching/odor/irritation, headaches, visual changes, shortness of breath, chest pain, abdominal pain, severe nausea/vomiting, or problems with urination or bowel movements unless otherwise stated above. Pertinent History Reviewed:  Reviewed past medical,surgical, social, obstetrical and family history.  Reviewed problem list, medications and allergies. Physical Assessment:   Vitals:   07/29/21 0954  BP: 112/68  Pulse: 89  Weight: 155 lb 8 oz (70.5 kg)  Body mass index is 25.1 kg/m.        Physical Examination:   General appearance: Well appearing, and in no distress  Mental status: Alert, oriented to person, place, and time  Skin: Warm & dry  Cardiovascular: Normal heart  rate noted  Respiratory: Normal respiratory effort, no distress  Abdomen: Soft, gravid, nontender  Pelvic: Cervical exam deferred         Extremities: Edema: None  Fetal Status: Fetal Heart Rate (bpm): 159 Fundal Height: 29 cm Movement: Present    Chaperone: N/A   No results found for this or any previous visit (from the past 24 hour(s)).  Assessment & Plan:  1) Low-risk pregnancy G1P0 at [redacted]w[redacted]d with an Estimated Date of Delivery: 09/28/21    Meds: No orders of the defined types were placed in this encounter.  Labs/procedures today: flu shot and tdap  Plan:  Continue routine obstetrical care  Next visit: prefers in person    Reviewed: Preterm labor symptoms and general obstetric precautions including but not limited to vaginal bleeding, contractions, leaking of fluid and fetal movement were reviewed in detail with the patient.  All questions were answered. Does have home bp cuff. Office bp cuff given: not applicable. Check bp weekly, let us know if consistently >140 and/or >90.  Follow-up: Return in about 2 weeks (around 08/12/2021) for LROB, CNM, in person.  No future appointments.  Orders Placed This Encounter  Procedures   Tdap vaccine greater than or equal to 7yo IM   Flu Vaccine QUAD 71mo+IM (Fluarix, Fluzone & Alfiuria Quad PF)   Cheral Marker CNM, [facility] 07/29/2021 10:12 AM

## 2021-07-29 NOTE — Patient Instructions (Signed)
Casondra, thank you for choosing our office today! We appreciate the opportunity to meet your healthcare needs. You may receive a short survey by mail, e-mail, or through [employer]. If you are happy with your care we would appreciate if you could take just a few minutes to complete the survey questions. We read all of your comments and take your feedback very seriously. Thank you again for choosing our office.  Center for [employer] Team at [facility]  [facility] at [facility] ([address] [place], [place] [address]) Entrance C, located off of [address] Free 24/7 valet parking   CLASSES: Go to [email or web address] to register for classes (childbirth, breastfeeding, waterbirth, infant CPR, daddy bootcamp, etc.)  Call the office [phone]) or go to [facility]) if: You begin to have strong, frequent contractions Your water breaks.  Sometimes it is a big gush of fluid, sometimes it is just a trickle that keeps getting your panties wet or running down your legs You have vaginal bleeding.  It is normal to have a small amount of spotting if your cervix was checked.  You don't feel your baby moving like normal.  If you don't, get you something to eat and drink and lay down and focus on feeling your baby move.   If your baby is still not moving like normal, you should call the office or go to [facility].  Call the office [phone]) or go to [facility] hospital for these signs of pre-eclampsia: Severe headache that does not go away with Tylenol Visual changes- seeing spots, double, blurred vision Pain under your right breast or upper abdomen that does not go away with Tums or heartburn medicine Nausea and/or vomiting Severe swelling in your hands, feet, and face   Tdap Vaccine It is recommended that you get the Tdap vaccine during the third trimester of EACH pregnancy to help protect your baby from getting pertussis (whooping cough) 27-36 weeks is the BEST time to do  this so that you can pass the protection on to your baby. During pregnancy is better than after pregnancy, but if you are unable to get it during pregnancy it will be offered at the hospital.  You can get this vaccine with [place], at the health department, your family doctor, or some local pharmacies Everyone who will be around your baby should also be up-to-date on their vaccines before the baby comes. Adults (who are not pregnant) only need 1 dose of Tdap during adulthood.   [facility] Pediatricians/Family Doctors [facility]): [address] Colette Ribas, [phone]           [facility]: [address], [phone]                [facility]): [address], [phone] (call to ask if accepting patients) [facility] Department: [address], [place], [phone]    [facility] Pediatricians/Family Doctors [facility]): [phone] [address], [phone] [facility]: [address] [place], [phone] [facility]: [address], [phone]  [facility] Doctors  [facility]): [phone] [facility]: [address] [phone]   [facility] Doctors [facility]: [address], [phone]  [facility] Family Doctors  [facility]: [phone], [phone]  Home Blood Pressure Monitoring for Patients   Your provider has recommended that you check your  blood pressure (BP) at least once a week at home. If you do not have a blood pressure cuff at home, one will be provided for you. Contact your provider if you have not received your monitor within 1 week.   Helpful Tips for Accurate Home Blood Pressure Checks  Don't smoke, exercise, or drink caffeine 30 minutes before checking your BP Use the restroom before checking your BP (a full bladder can raise your  pressure) Relax in a comfortable upright chair Feet on the ground Left arm resting comfortably on a flat surface at the level of your heart Legs uncrossed Back supported Sit quietly and don't talk Place the cuff on your bare arm Adjust snuggly, so that only two fingertips can fit between your skin and the top of the cuff Check 2 readings separated by at least one minute Keep a log of your BP readings For a visual, please reference this diagram: http://ccnc.care/bpdiagram  Provider Name: [facility]     Phone: [phone]  Zone 1: ALL CLEAR  Continue to monitor your symptoms:  BP reading is less than 140 (top number) or less than 90 (bottom number)  No right upper stomach pain No headaches or seeing spots No feeling nauseated or throwing up No swelling in face and hands  Zone 2: CAUTION Call your doctor's office for any of the following:  BP reading is greater than 140 (top number) or greater than 90 (bottom number)  Stomach pain under your ribs in the middle or right side Headaches or seeing spots Feeling nauseated or throwing up Swelling in face and hands  Zone 3: EMERGENCY  Seek immediate medical care if you have any of the following:  BP reading is greater than160 (top number) or greater than 110 (bottom number) Severe headaches not improving with Tylenol Serious difficulty catching your breath Any worsening symptoms from Zone 2   Third Trimester of Pregnancy The third trimester is from week 29 through week 42, months 7 through 9. The third trimester is a time when the fetus is growing rapidly. At the end of the ninth month, the fetus is about 20 inches in length and weighs 6-10 pounds.  BODY CHANGES Your body goes through many changes during pregnancy. The changes vary from woman to woman.  Your weight will continue to increase. You can expect to gain 25-35 pounds (11-16 kg) by the end of the pregnancy. You may begin to get stretch marks on your hips, abdomen,  and breasts. You may urinate more often because the fetus is moving lower into your pelvis and pressing on your bladder. You may develop or continue to have heartburn as a result of your pregnancy. You may develop constipation because certain hormones are causing the muscles that push waste through your intestines to slow down. You may develop hemorrhoids or swollen, bulging veins (varicose veins). You may have pelvic pain because of the weight gain and pregnancy hormones relaxing your joints between the bones in your pelvis. Backaches may result from overexertion of the muscles supporting your posture. You may have changes in your hair. These can include thickening of your hair, rapid growth, and changes in texture. Some women also have hair loss during or after pregnancy, or hair that feels dry or thin. Your hair will most likely return to normal after your baby is born. Your breasts will continue to grow and be tender. A yellow discharge may leak from your breasts called colostrum. Your belly button may stick out. You may   feel short of breath because of your expanding uterus. You may notice the fetus "dropping," or moving lower in your abdomen. You may have a bloody mucus discharge. This usually occurs a few days to a week before labor begins. Your cervix becomes thin and soft (effaced) near your due date. WHAT TO EXPECT AT YOUR PRENATAL EXAMS  You will have prenatal exams every 2 weeks until week 36. Then, you will have weekly prenatal exams. During a routine prenatal visit: You will be weighed to make sure you and the fetus are growing normally. Your blood pressure is taken. Your abdomen will be measured to track your baby's growth. The fetal heartbeat will be listened to. Any test results from the previous visit will be discussed. You may have a cervical check near your due date to see if you have effaced. At around 36 weeks, your caregiver will check your cervix. At the same time, your  caregiver will also perform a test on the secretions of the vaginal tissue. This test is to determine if a type of bacteria, Group B streptococcus, is present. Your caregiver will explain this further. Your caregiver may ask you: What your birth plan is. How you are feeling. If you are feeling the baby move. If you have had any abnormal symptoms, such as leaking fluid, bleeding, severe headaches, or abdominal cramping. If you have any questions. Other tests or screenings that may be performed during your third trimester include: Blood tests that check for low iron levels (anemia). Fetal testing to check the health, activity level, and growth of the fetus. Testing is done if you have certain medical conditions or if there are problems during the pregnancy. FALSE LABOR You may feel small, irregular contractions that eventually go away. These are called Braxton Hicks contractions, or false labor. Contractions may last for hours, days, or even weeks before true labor sets in. If contractions come at regular intervals, intensify, or become painful, it is best to be seen by your caregiver.  SIGNS OF LABOR  Menstrual-like cramps. Contractions that are 5 minutes apart or less. Contractions that start on the top of the uterus and spread down to the lower abdomen and back. A sense of increased pelvic pressure or back pain. A watery or bloody mucus discharge that comes from the vagina. If you have any of these signs before the 37th week of pregnancy, call your caregiver right away. You need to go to the hospital to get checked immediately. HOME CARE INSTRUCTIONS  Avoid all smoking, herbs, alcohol, and unprescribed drugs. These chemicals affect the formation and growth of the baby. Follow your caregiver's instructions regarding medicine use. There are medicines that are either safe or unsafe to take during pregnancy. Exercise only as directed by your caregiver. Experiencing uterine cramps is a good sign to  stop exercising. Continue to eat regular, healthy meals. Wear a good support bra for breast tenderness. Do not use hot tubs, steam rooms, or saunas. Wear your seat belt at all times when driving. Avoid raw meat, uncooked cheese, cat litter boxes, and soil used by cats. These carry germs that can cause birth defects in the baby. Take your prenatal vitamins. Try taking a stool softener (if your caregiver approves) if you develop constipation. Eat more high-fiber foods, such as fresh vegetables or fruit and whole grains. Drink plenty of fluids to keep your urine clear or pale yellow. Take warm sitz baths to soothe any pain or discomfort caused by hemorrhoids. Use hemorrhoid cream if   your caregiver approves. If you develop varicose veins, wear support hose. Elevate your feet for 15 minutes, 3-4 times a day. Limit salt in your diet. Avoid heavy lifting, wear low heal shoes, and practice good posture. Rest a lot with your legs elevated if you have leg cramps or low back pain. Visit your dentist if you have not gone during your pregnancy. Use a soft toothbrush to brush your teeth and be gentle when you floss. A sexual relationship may be continued unless your caregiver directs you otherwise. Do not travel far distances unless it is absolutely necessary and only with the approval of your caregiver. Take prenatal classes to understand, practice, and ask questions about the labor and delivery. Make a trial run to the hospital. Pack your hospital bag. Prepare the baby's nursery. Continue to go to all your prenatal visits as directed by your caregiver. SEEK MEDICAL CARE IF: You are unsure if you are in labor or if your water has broken. You have dizziness. You have mild pelvic cramps, pelvic pressure, or nagging pain in your abdominal area. You have persistent nausea, vomiting, or diarrhea. You have a bad smelling vaginal discharge. You have pain with urination. SEEK IMMEDIATE MEDICAL CARE IF:  You  have a fever. You are leaking fluid from your vagina. You have spotting or bleeding from your vagina. You have severe abdominal cramping or pain. You have rapid weight loss or gain. You have shortness of breath with chest pain. You notice sudden or extreme swelling of your face, hands, ankles, feet, or legs. You have not felt your baby move in over an hour. You have severe headaches that do not go away with medicine. You have vision changes. Document Released: 09/13/2001 Document Revised: 09/24/2013 Document Reviewed: 11/20/2012 Exit[employer] Patient Information 2015 [employer]. This information is not intended to replace advice given to you by your health care provider. Make sure you discuss any questions you have with your health care provider.       

## 2021-08-09 ENCOUNTER — Other Ambulatory Visit: Payer: Self-pay

## 2021-08-09 ENCOUNTER — Encounter (HOSPITAL_COMMUNITY): Payer: Self-pay | Admitting: Obstetrics and Gynecology

## 2021-08-09 ENCOUNTER — Inpatient Hospital Stay (HOSPITAL_COMMUNITY)
Admission: AD | Admit: 2021-08-09 | Discharge: 2021-08-09 | Disposition: A | Discharge status: Home / Self Care | Payer: Medicaid Other | Attending: Obstetrics and Gynecology | Admitting: Obstetrics and Gynecology

## 2021-08-09 DIAGNOSIS — F1290 Cannabis use, unspecified, uncomplicated: Secondary | ICD-10-CM

## 2021-08-09 DIAGNOSIS — O0930 Supervision of pregnancy with insufficient antenatal care, unspecified trimester: Secondary | ICD-10-CM

## 2021-08-09 DIAGNOSIS — R8271 Bacteriuria: Secondary | ICD-10-CM | POA: Insufficient documentation

## 2021-08-09 DIAGNOSIS — O0933 Supervision of pregnancy with insufficient antenatal care, third trimester: Secondary | ICD-10-CM | POA: Diagnosis present

## 2021-08-09 DIAGNOSIS — Z3689 Encounter for other specified antenatal screening: Secondary | ICD-10-CM

## 2021-08-09 DIAGNOSIS — Z3403 Encounter for supervision of normal first pregnancy, third trimester: Secondary | ICD-10-CM

## 2021-08-09 DIAGNOSIS — Z3A32 32 weeks gestation of pregnancy: Secondary | ICD-10-CM | POA: Insufficient documentation

## 2021-08-09 DIAGNOSIS — N898 Other specified noninflammatory disorders of vagina: Secondary | ICD-10-CM

## 2021-08-09 DIAGNOSIS — O99820 Streptococcus B carrier state complicating pregnancy: Secondary | ICD-10-CM

## 2021-08-09 DIAGNOSIS — O26893 Other specified pregnancy related conditions, third trimester: Secondary | ICD-10-CM | POA: Diagnosis not present

## 2021-08-09 DIAGNOSIS — O99323 Drug use complicating pregnancy, third trimester: Secondary | ICD-10-CM | POA: Insufficient documentation

## 2021-08-09 DIAGNOSIS — R109 Unspecified abdominal pain: Secondary | ICD-10-CM | POA: Diagnosis not present

## 2021-08-09 HISTORY — DX: Gastro-esophageal reflux disease without esophagitis: K21.9

## 2021-08-09 LAB — URINALYSIS, ROUTINE W REFLEX MICROSCOPIC
Bilirubin Urine: NEGATIVE
Glucose, UA: NEGATIVE mg/dL
Hgb urine dipstick: NEGATIVE
Ketones, ur: NEGATIVE mg/dL
Nitrite: NEGATIVE
Protein, ur: NEGATIVE mg/dL
Specific Gravity, Urine: 1.026 (ref 1.005–1.030)
pH: 5 (ref 5.0–8.0)

## 2021-08-09 LAB — WET PREP, GENITAL
Clue Cells Wet Prep HPF POC: NONE SEEN
Sperm: NONE SEEN
Trich, Wet Prep: NONE SEEN
Yeast Wet Prep HPF POC: NONE SEEN

## 2021-08-09 LAB — POCT FERN TEST: POCT Fern Test: NEGATIVE

## 2021-08-09 NOTE — MAU Provider Note (Signed)
History   [id number]   Chief Complaint  Patient presents with   Vaginal Discharge   Abdominal Pain   possible rupture of membranes    HPI Meghan Mitchell is a 20 y.o. female  G1P0 @32 .6 wks here with report of leaking small amt of fluid after she voided. Leaking of fluid has not continued. Pt reports no contractions. She denies vaginal bleeding. Last intercourse was 2 days ago. She reports good fetal movement. All other systems negative.    Patient's last menstrual period was 12/22/2020 (exact date).  OB History  Gravida Para Term Preterm AB Living  1            SAB IAB Ectopic Multiple Live Births               # Outcome Date GA Lbr Len/2nd Weight Sex Delivery Anes PTL Lv  1 Current             Past Medical History:  Diagnosis Date   GERD (gastroesophageal reflux disease)     Family History  Problem Relation Age of Onset   Diabetes Maternal Grandmother    Breast cancer Paternal Grandmother     Social History   Socioeconomic History   Marital status: Single    Spouse name: Not on file   Number of children: Not on file   Years of education: Not on file   Highest education level: Not on file  Occupational History   Not on file  Tobacco Use   Smoking status: Never   Smokeless tobacco: Never  Vaping Use   Vaping Use: Former  Substance and Sexual Activity   Alcohol use: Not Currently   Drug use: Never   Sexual activity: Yes    Birth control/protection: None  Other Topics Concern   Not on file  Social History Narrative   Not on file   Social Determinants of Health   Financial Resource Strain: Low Risk    Difficulty of Paying Living Expenses: Not hard at all  Food Insecurity: No Food Insecurity   Worried About 12/24/2020 in the Last Year: Never true   Ran Out of Food in the Last Year: Never true  Transportation Needs: No Transportation Needs   Lack of Transportation (Medical): No   Lack of Transportation (Non-Medical): No  Physical  Activity: Insufficiently Active   Days of Exercise per Week: 2 days   Minutes of Exercise per Session: 20 min  Stress: No Stress Concern Present   Feeling of Stress : Not at all  Social Connections: Moderately Isolated   Frequency of Communication with Friends and Family: Three times a week   Frequency of Social Gatherings with Friends and Family: Once a week   Attends Religious Services: Never   Programme researcher, broadcasting/film/video or Organizations: No   Attends Database administrator Meetings: Never   Marital Status: Living with partner    No Known Allergies  No current facility-administered medications on file prior to encounter.   Current Outpatient Medications on File Prior to Encounter  Medication Sig Dispense Refill   Prenatal Vit-Fe Fumarate-FA (PRENATAL VITAMIN PO) Take by mouth.     Blood Pressure Monitor MISC For regular home bp monitoring during pregnancy 1 each 0     Review of Systems  Gastrointestinal:  Negative for abdominal pain.  Genitourinary:  Positive for vaginal discharge. Negative for vaginal bleeding.    Physical Exam   Vitals:   08/09/21 1850  BP: 122/76  Pulse: 13/07/22)  115  Resp: 15  Temp: 98.3 F (36.8 C)  TempSrc: Oral  Weight: 70.9 kg    Physical Exam Vitals and nursing note reviewed. Exam conducted with a chaperone present.  Constitutional:      General: She is not in acute distress.    Appearance: Normal appearance.  HENT:     Head: Normocephalic and atraumatic.  Pulmonary:     Effort: Pulmonary effort is normal. No respiratory distress.  Abdominal:     Palpations: Abdomen is soft.     Tenderness: There is no abdominal tenderness.  Genitourinary:    Comments: SSE: no pool SVE: closed/thick Musculoskeletal:        General: Normal range of motion.     Cervical back: Normal range of motion.  Skin:    General: Skin is warm and dry.  Neurological:     General: No focal deficit present.     Mental Status: She is alert and oriented to person, place,  and time.  Psychiatric:        Mood and Affect: Mood normal.        Behavior: Behavior normal.  EFM: 140 bpm, mod variability, + accels, no decels Toco: UI   Results for orders placed or performed during the hospital encounter of 08/09/21 (from the past 72 hour(s))  Urinalysis, Routine w reflex microscopic Urine, Clean Catch     Status: Abnormal   Collection Time: 08/09/21  7:12 PM  Result Value Ref Range   Color, Urine YELLOW YELLOW   APPearance CLOUDY (A) CLEAR   Specific Gravity, Urine 1.026 1.005 - 1.030   pH 5.0 5.0 - 8.0   Glucose, UA NEGATIVE NEGATIVE mg/dL   Hgb urine dipstick NEGATIVE NEGATIVE   Bilirubin Urine NEGATIVE NEGATIVE   Ketones, ur NEGATIVE NEGATIVE mg/dL   Protein, ur NEGATIVE NEGATIVE mg/dL   Nitrite NEGATIVE NEGATIVE   Leukocytes,Ua SMALL (A) NEGATIVE   RBC / HPF 0-5 0 - 5 RBC/hpf   WBC, UA 6-10 0 - 5 WBC/hpf   Bacteria, UA FEW (A) NONE SEEN   Squamous Epithelial / LPF 0-5 0 - 5   Mucus PRESENT    Ca Oxalate Crys, UA PRESENT     Comment: Performed at [facility], [address], [place], [place] [address]  Wet prep, genital     Status: Abnormal   Collection Time: 08/09/21  7:46 PM   Specimen: PATH Cytology Cervicovaginal Ancillary Only  Result Value Ref Range   Yeast Wet Prep HPF POC NONE SEEN NONE SEEN   Trich, Wet Prep NONE SEEN NONE SEEN   Clue Cells Wet Prep HPF POC NONE SEEN NONE SEEN   WBC, Wet Prep HPF POC MANY (A) NONE SEEN   Sperm NONE SEEN     Comment: Performed at [facility], [address], [place], [place] [address]  GC/Chlamydia probe amp ([place])not at [facility]     Status: None   Collection Time: 08/09/21  7:46 PM  Result Value Ref Range   Chlamydia Negative    Neisseria Gonorrhea Negative    Comment Normal Reference Ranger Chlamydia - Negative    Comment      Normal Reference Range Neisseria Gonorrhea - Negative  POCT fern test     Status: None   Collection Time: 08/09/21  7:56 PM  Result Value Ref Range   POCT  Fern Test Negative = intact amniotic membranes    MAU Course  Procedures  MDM Labs ordered and reviewed.  No signs of SROM or labor. Stable for discharge.  Assessment and Plan   1. Marijuana use   2. Late prenatal care   3. Encounter for supervision of normal first pregnancy in third trimester   4. GBS bacteriuria   5. [redacted] weeks gestation of pregnancy   6. NST (non-stress test) reactive    Discharge home Follow up at [facility] as scheduled this week PTL precautions  Allergies as of 08/09/2021   No Known Allergies      Medication List     TAKE these medications    Blood Pressure Monitor Misc For regular home bp monitoring during pregnancy   PRENATAL VITAMIN PO Take by mouth.        Donette Larry, CNM 08/11/2021 1:08 PM

## 2021-08-09 NOTE — MAU Note (Signed)
.  Meghan Mitchell is a 20 y.o. at [redacted]w[redacted]d here in MAU reporting: possible rupture of membranes around 1740. States she had just voided and shortly after she had a gush of clear fluid in her underwear. States she has not had any leaking since. Denies VB. Last IC was x2 days ago. Endorses good fetal movement. Reports she feels some lower abdominal cramping as well that has been on and off for the past few weeks.   Pain score: 4 Vitals:   08/09/21 1850  BP: 122/76  Pulse: (!) 115  Resp: 15  Temp: 98.3 F (36.8 C)     FHT:135 Lab orders placed from triage:  UA

## 2021-08-10 LAB — GC/CHLAMYDIA PROBE AMP (~~LOC~~) NOT AT ARMC
Chlamydia: NEGATIVE
Comment: NEGATIVE
Comment: NORMAL
Neisseria Gonorrhea: NEGATIVE

## 2021-08-12 ENCOUNTER — Encounter: Payer: Self-pay | Admitting: Women's Health

## 2021-08-12 ENCOUNTER — Other Ambulatory Visit: Payer: Self-pay

## 2021-08-12 ENCOUNTER — Ambulatory Visit (INDEPENDENT_AMBULATORY_CARE_PROVIDER_SITE_OTHER): Payer: Medicaid Other | Admitting: Women's Health

## 2021-08-12 VITALS — BP 112/65 | HR 97 | Wt 157.0 lb

## 2021-08-12 DIAGNOSIS — Z029 Encounter for administrative examinations, unspecified: Secondary | ICD-10-CM

## 2021-08-12 DIAGNOSIS — Z3403 Encounter for supervision of normal first pregnancy, third trimester: Secondary | ICD-10-CM

## 2021-08-12 NOTE — Progress Notes (Signed)
    LOW-RISK PREGNANCY VISIT Patient name: Meghan Mitchell MRN [id number]  Date of birth: 2001-05-10 Chief Complaint:   Routine Prenatal Visit  History of Present Illness:   Meghan Mitchell is a 20 y.o. G1P0 female at [redacted]w[redacted]d with an Estimated Date of Delivery: 09/28/21 being seen today for ongoing management of a low-risk pregnancy.   Today she reports no complaints. Contractions: Irritability. Vag. Bleeding: None.  Movement: Present. denies leaking of fluid.  Depression screen [facility] 2/9 07/01/2021 05/05/2021  Decreased Interest 0 0  Down, Depressed, Hopeless 0 0  PHQ - 2 Score 0 0  Altered sleeping 0 1  Tired, decreased energy 0 1  Change in appetite 0 1  Feeling bad or failure about yourself  0 0  Trouble concentrating 0 0  Moving slowly or fidgety/restless 0 0  Suicidal thoughts 0 0  PHQ-9 Score 0 3     GAD 7 : Generalized Anxiety Score 07/01/2021 05/05/2021  Nervous, Anxious, on Edge 0 0  Control/stop worrying 0 0  Worry too much - different things 0 0  Trouble relaxing 0 0  Restless 0 0  Easily annoyed or irritable 0 0  Afraid - awful might happen 0 0  Total GAD 7 Score 0 0      Review of Systems:   Pertinent items are noted in HPI Denies abnormal vaginal discharge w/ itching/odor/irritation, headaches, visual changes, shortness of breath, chest pain, abdominal pain, severe nausea/vomiting, or problems with urination or bowel movements unless otherwise stated above. Pertinent History Reviewed:  Reviewed past medical,surgical, social, obstetrical and family history.  Reviewed problem list, medications and allergies. Physical Assessment:   Vitals:   08/12/21 1205  BP: 112/65  Pulse: 97  Weight: 157 lb (71.2 kg)  Body mass index is 25.34 kg/m.        Physical Examination:   General appearance: Well appearing, and in no distress  Mental status: Alert, oriented to person, place, and time  Skin: Warm & dry  Cardiovascular: Normal heart rate noted  Respiratory: Normal  respiratory effort, no distress  Abdomen: Soft, gravid, nontender  Pelvic: Cervical exam deferred         Extremities: Edema: None  Fetal Status: Fetal Heart Rate (bpm): 143 Fundal Height: 31 cm Movement: Present    Chaperone: N/A   No results found for this or any previous visit (from the past 24 hour(s)).  Assessment & Plan:  1) Low-risk pregnancy G1P0 at [redacted]w[redacted]d with an Estimated Date of Delivery: 09/28/21    Meds: No orders of the defined types were placed in this encounter.  Labs/procedures today: none  Plan:  Continue routine obstetrical care  Next visit: prefers in person    Reviewed: Preterm labor symptoms and general obstetric precautions including but not limited to vaginal bleeding, contractions, leaking of fluid and fetal movement were reviewed in detail with the patient.  All questions were answered. Does have home bp cuff. Office bp cuff given: not applicable. Check bp weekly, let us know if consistently >140 and/or >90.  Follow-up: Return in about 2 weeks (around 08/26/2021) for LROB, CNM, in person.  No future appointments.  No orders of the defined types were placed in this encounter.  Cheral Marker CNM, [facility] 08/12/2021 12:17 PM

## 2021-08-12 NOTE — Patient Instructions (Signed)
Meghan Mitchell, thank you for choosing our office today! We appreciate the opportunity to meet your healthcare needs. You may receive a short survey by mail, e-mail, or through [employer]. If you are happy with your care we would appreciate if you could take just a few minutes to complete the survey questions. We read all of your comments and take your feedback very seriously. Thank you again for choosing our office.  Center for [employer] Team at [facility]  [facility] at [facility] ([address] [place], [place] [address]) Entrance C, located off of [address] Free 24/7 valet parking   CLASSES: Go to [email or web address] to register for classes (childbirth, breastfeeding, waterbirth, infant CPR, daddy bootcamp, etc.)  Call the office [phone]) or go to [facility]) if: You begin to have strong, frequent contractions Your water breaks.  Sometimes it is a big gush of fluid, sometimes it is just a trickle that keeps getting your panties wet or running down your legs You have vaginal bleeding.  It is normal to have a small amount of spotting if your cervix was checked.  You don't feel your baby moving like normal.  If you don't, get you something to eat and drink and lay down and focus on feeling your baby move.   If your baby is still not moving like normal, you should call the office or go to [facility].  Call the office [phone]) or go to [facility] hospital for these signs of pre-eclampsia: Severe headache that does not go away with Tylenol Visual changes- seeing spots, double, blurred vision Pain under your right breast or upper abdomen that does not go away with Tums or heartburn medicine Nausea and/or vomiting Severe swelling in your hands, feet, and face   Tdap Vaccine It is recommended that you get the Tdap vaccine during the third trimester of EACH pregnancy to help protect your baby from getting pertussis (whooping cough) 27-36 weeks is the BEST time to do  this so that you can pass the protection on to your baby. During pregnancy is better than after pregnancy, but if you are unable to get it during pregnancy it will be offered at the hospital.  You can get this vaccine with [place], at the health department, your family doctor, or some local pharmacies Everyone who will be around your baby should also be up-to-date on their vaccines before the baby comes. Adults (who are not pregnant) only need 1 dose of Tdap during adulthood.   [facility] Pediatricians/Family Doctors  [facility]): [address] Colette Ribas, [phone]           [facility]: [address], [phone]                [facility]): [address], [phone] (call to ask if accepting patients) [facility] Department: [address], [place], [phone]    [facility] Pediatricians/Family Doctors [facility]): [phone] [address], [phone] [facility]: [address] [place], [phone] [facility]: [address], [phone]  [facility] Doctors  [facility]): [phone] [facility]: [address] [phone]   [facility] Doctors [facility]: [address], [phone]  [facility] Family Doctors  [facility]: [phone], [phone]  Home Blood Pressure Monitoring for Patients   Your provider has recommended that you check your  blood pressure (BP) at least once a week at home. If you do not have a blood pressure cuff at home, one will be provided for you. Contact your provider if you have not received your monitor within 1 week.   Helpful Tips for Accurate Home Blood Pressure Checks  Don't smoke, exercise, or drink caffeine 30 minutes before checking your BP Use the restroom before checking your BP (a full bladder can raise your  pressure) Relax in a comfortable upright chair Feet on the ground Left arm resting comfortably on a flat surface at the level of your heart Legs uncrossed Back supported Sit quietly and don't talk Place the cuff on your bare arm Adjust snuggly, so that only two fingertips can fit between your skin and the top of the cuff Check 2 readings separated by at least one minute Keep a log of your BP readings For a visual, please reference this diagram: http://ccnc.care/bpdiagram  Provider Name: [facility]     Phone: [phone]  Zone 1: ALL CLEAR  Continue to monitor your symptoms:  BP reading is less than 140 (top number) or less than 90 (bottom number)  No right upper stomach pain No headaches or seeing spots No feeling nauseated or throwing up No swelling in face and hands  Zone 2: CAUTION Call your doctor's office for any of the following:  BP reading is greater than 140 (top number) or greater than 90 (bottom number)  Stomach pain under your ribs in the middle or right side Headaches or seeing spots Feeling nauseated or throwing up Swelling in face and hands  Zone 3: EMERGENCY  Seek immediate medical care if you have any of the following:  BP reading is greater than160 (top number) or greater than 110 (bottom number) Severe headaches not improving with Tylenol Serious difficulty catching your breath Any worsening symptoms from Zone 2  Preterm Labor and Birth Information  The normal length of a pregnancy is 39-41 weeks. Preterm labor is when labor starts before 37 completed weeks of pregnancy. What are the risk factors for preterm labor? Preterm labor is more likely to occur in women who: Have certain infections during pregnancy such as a bladder infection, sexually transmitted infection, or infection inside the uterus (chorioamnionitis). Have a shorter-than-normal cervix. Have gone into preterm labor before. Have had surgery on their cervix. Are younger than age 17  or older than age 35. Are African American. Are pregnant with twins or multiple babies (multiple gestation). Take street drugs or smoke while pregnant. Do not gain enough weight while pregnant. Became pregnant shortly after having been pregnant. What are the symptoms of preterm labor? Symptoms of preterm labor include: Cramps similar to those that can happen during a menstrual period. The cramps may happen with diarrhea. Pain in the abdomen or lower back. Regular uterine contractions that may feel like tightening of the abdomen. A feeling of increased pressure in the pelvis. Increased watery or bloody mucus discharge from the vagina. Water breaking (ruptured amniotic sac). Why is it important to recognize signs of preterm labor? It is important to recognize signs of preterm labor because babies who are born prematurely may not be fully developed. This can put them at an increased risk for: Long-term (chronic) heart and lung problems. Difficulty immediately after birth with regulating body systems, including blood sugar, body temperature, heart rate, and breathing rate. Bleeding in the brain. Cerebral palsy. Learning difficulties. Death. These risks are highest for babies who are born before 34 weeks   of pregnancy. How is preterm labor treated? Treatment depends on the length of your pregnancy, your condition, and the health of your baby. It may involve: Having a stitch (suture) placed in your cervix to prevent your cervix from opening too early (cerclage). Taking or being given medicines, such as: Hormone medicines. These may be given early in pregnancy to help support the pregnancy. Medicine to stop contractions. Medicines to help mature the baby's lungs. These may be prescribed if the risk of delivery is high. Medicines to prevent your baby from developing cerebral palsy. If the labor happens before 34 weeks of pregnancy, you may need to stay in the hospital. What should I do if I  think I am in preterm labor? If you think that you are going into preterm labor, call your health care provider right away. How can I prevent preterm labor in future pregnancies? To increase your chance of having a full-term pregnancy: Do not use any tobacco products, such as cigarettes, chewing tobacco, and e-cigarettes. If you need help quitting, ask your health care provider. Do not use street drugs or medicines that have not been prescribed to you during your pregnancy. Talk with your health care provider before taking any herbal supplements, even if you have been taking them regularly. Make sure you gain a healthy amount of weight during your pregnancy. Watch for infection. If you think that you might have an infection, get it checked right away. Make sure to tell your health care provider if you have gone into preterm labor before. This information is not intended to replace advice given to you by your health care provider. Make sure you discuss any questions you have with your health care provider. Document Revised: 01/11/2019 Document Reviewed: 02/10/2016 Elsevier Patient Education  2020 Elsevier Inc.   

## 2021-08-14 DIAGNOSIS — J029 Acute pharyngitis, unspecified: Secondary | ICD-10-CM | POA: Diagnosis not present

## 2021-08-19 ENCOUNTER — Encounter: Payer: Self-pay | Admitting: *Deleted

## 2021-08-24 ENCOUNTER — Encounter: Payer: Self-pay | Admitting: Women's Health

## 2021-08-30 ENCOUNTER — Encounter: Payer: Self-pay | Admitting: Obstetrics & Gynecology

## 2021-08-30 ENCOUNTER — Ambulatory Visit (INDEPENDENT_AMBULATORY_CARE_PROVIDER_SITE_OTHER): Payer: Medicaid Other | Admitting: Obstetrics & Gynecology

## 2021-08-30 ENCOUNTER — Other Ambulatory Visit: Payer: Self-pay

## 2021-08-30 VITALS — BP 122/72 | HR 96 | Wt 160.0 lb

## 2021-08-30 DIAGNOSIS — Z3403 Encounter for supervision of normal first pregnancy, third trimester: Secondary | ICD-10-CM

## 2021-08-30 DIAGNOSIS — O26843 Uterine size-date discrepancy, third trimester: Secondary | ICD-10-CM

## 2021-08-30 NOTE — Progress Notes (Signed)
   LOW-RISK PREGNANCY VISIT Patient name: Meghan Mitchell MRN [id number]  Date of birth: 20-Apr-2001 Chief Complaint:   Routine Prenatal Visit  History of Present Illness:   Meghan Mitchell is a 20 y.o. G1P0 female at [redacted]w[redacted]d with an Estimated Date of Delivery: 09/28/21 being seen today for ongoing management of a low-risk pregnancy.  Depression screen [facility] 2/9 07/01/2021 05/05/2021  Decreased Interest 0 0  Down, Depressed, Hopeless 0 0  PHQ - 2 Score 0 0  Altered sleeping 0 1  Tired, decreased energy 0 1  Change in appetite 0 1  Feeling bad or failure about yourself  0 0  Trouble concentrating 0 0  Moving slowly or fidgety/restless 0 0  Suicidal thoughts 0 0  PHQ-9 Score 0 3    Today she reports  no acute complaints . Contractions: Irritability. Vag. Bleeding: None.  Movement: Present. denies leaking of fluid. Review of Systems:   Pertinent items are noted in HPI Denies abnormal vaginal discharge w/ itching/odor/irritation, headaches, visual changes, shortness of breath, chest pain, abdominal pain, severe nausea/vomiting, or problems with urination or bowel movements unless otherwise stated above. Pertinent History Reviewed:  Reviewed past medical,surgical, social, obstetrical and family history.  Reviewed problem list, medications and allergies.  Physical Assessment:   Vitals:   08/30/21 1038  BP: 122/72  Pulse: 96  Weight: 160 lb (72.6 kg)  Body mass index is 25.82 kg/m.        Physical Examination:   General appearance: Well appearing, and in no distress  Mental status: Alert, oriented to person, place, and time  Skin: Warm & dry  Respiratory: Normal respiratory effort, no distress  Abdomen: Soft, gravid, nontender  Pelvic: Cervical exam deferred         Extremities: Edema: None  Psych:  mood and affect appropriate  Fetal Status: Fetal Heart Rate (bpm): 145 Fundal Height: 30 cm Movement: Present    Chaperone: n/a    No results found for this or any previous visit  (from the past 24 hour(s)).   Assessment & Plan:  1) Low-risk pregnancy G1P0 at [redacted]w[redacted]d with an Estimated Date of Delivery: 09/28/21   2) Small for dates- []  growth scan next available  3) GBS pos- PCN in labor   Meds: No orders of the defined types were placed in this encounter.  Labs/procedures today: doppler  Plan:  Continue routine obstetrical care  Next visit: prefers in person    Reviewed: Preterm labor symptoms and general obstetric precautions including but not limited to vaginal bleeding, contractions, leaking of fluid and fetal movement were reviewed in detail with the patient.  All questions were answered  Follow-up: Return in about 1 week (around 09/06/2021) for LROB visit.  No orders of the defined types were placed in this encounter.   14/01/2021, DO Attending Obstetrician & Gynecologist, [facility] for [facility]

## 2021-08-31 ENCOUNTER — Other Ambulatory Visit: Payer: Self-pay | Admitting: Obstetrics & Gynecology

## 2021-08-31 DIAGNOSIS — O26843 Uterine size-date discrepancy, third trimester: Secondary | ICD-10-CM

## 2021-09-01 ENCOUNTER — Ambulatory Visit (INDEPENDENT_AMBULATORY_CARE_PROVIDER_SITE_OTHER): Payer: Medicaid Other

## 2021-09-01 ENCOUNTER — Other Ambulatory Visit: Payer: Self-pay

## 2021-09-01 DIAGNOSIS — Z3A36 36 weeks gestation of pregnancy: Secondary | ICD-10-CM | POA: Diagnosis not present

## 2021-09-01 DIAGNOSIS — O26843 Uterine size-date discrepancy, third trimester: Secondary | ICD-10-CM

## 2021-09-01 DIAGNOSIS — O0930 Supervision of pregnancy with insufficient antenatal care, unspecified trimester: Secondary | ICD-10-CM

## 2021-09-01 DIAGNOSIS — F1290 Cannabis use, unspecified, uncomplicated: Secondary | ICD-10-CM

## 2021-09-01 DIAGNOSIS — R8271 Bacteriuria: Secondary | ICD-10-CM

## 2021-09-01 DIAGNOSIS — Z3403 Encounter for supervision of normal first pregnancy, third trimester: Secondary | ICD-10-CM

## 2021-09-01 NOTE — Progress Notes (Signed)
[place] 36+1 wks,cephalic,anterior placenta gr 3,fhr 138 bpm,AFI 17 cm,normal ovaries,efw 2862 g 52%

## 2021-09-05 ENCOUNTER — Encounter (HOSPITAL_COMMUNITY): Payer: Self-pay | Admitting: Obstetrics and Gynecology

## 2021-09-05 ENCOUNTER — Other Ambulatory Visit: Payer: Self-pay

## 2021-09-05 ENCOUNTER — Inpatient Hospital Stay (HOSPITAL_COMMUNITY)
Admission: AD | Admit: 2021-09-05 | Discharge: 2021-09-05 | Disposition: A | Discharge status: Home / Self Care | Payer: Medicaid Other | Source: Ambulatory Visit | Attending: Obstetrics and Gynecology | Admitting: Obstetrics and Gynecology

## 2021-09-05 DIAGNOSIS — F17290 Nicotine dependence, other tobacco product, uncomplicated: Secondary | ICD-10-CM | POA: Insufficient documentation

## 2021-09-05 DIAGNOSIS — R102 Pelvic and perineal pain: Secondary | ICD-10-CM | POA: Diagnosis not present

## 2021-09-05 DIAGNOSIS — Y939 Activity, unspecified: Secondary | ICD-10-CM | POA: Insufficient documentation

## 2021-09-05 DIAGNOSIS — Y929 Unspecified place or not applicable: Secondary | ICD-10-CM | POA: Diagnosis not present

## 2021-09-05 DIAGNOSIS — O26893 Other specified pregnancy related conditions, third trimester: Secondary | ICD-10-CM | POA: Insufficient documentation

## 2021-09-05 DIAGNOSIS — S334XXA Traumatic rupture of symphysis pubis, initial encounter: Secondary | ICD-10-CM | POA: Insufficient documentation

## 2021-09-05 DIAGNOSIS — O2693 Pregnancy related conditions, unspecified, third trimester: Secondary | ICD-10-CM

## 2021-09-05 DIAGNOSIS — Z3A36 36 weeks gestation of pregnancy: Secondary | ICD-10-CM | POA: Insufficient documentation

## 2021-09-05 LAB — URINALYSIS, ROUTINE W REFLEX MICROSCOPIC
Bilirubin Urine: NEGATIVE
Glucose, UA: NEGATIVE mg/dL
Hgb urine dipstick: NEGATIVE
Ketones, ur: NEGATIVE mg/dL
Nitrite: NEGATIVE
Protein, ur: NEGATIVE mg/dL
Specific Gravity, Urine: 1.017 (ref 1.005–1.030)
pH: 6 (ref 5.0–8.0)

## 2021-09-05 LAB — OB RESULTS CONSOLE GBS: GBS: POSITIVE

## 2021-09-05 MED ORDER — CYCLOBENZAPRINE HCL 10 MG PO TABS: 10.0000 mg | ORAL_TABLET | Freq: Three times a day (TID) | ORAL | 0 refills | Status: DC | PRN

## 2021-09-05 NOTE — MAU Provider Note (Signed)
History     CSN: [id number]  Arrival date and time: 09/05/21 1437   Event Date/Time   First Provider Initiated Contact with Patient 09/05/21 1558      Chief Complaint  Patient presents with   Pelvic Pain   HPI  Ms.Meghan Mitchell is a 20 y.o. female G1P0 @ [redacted]w[redacted]d here in MAU with complaints of pelvic pain that worsens when she walks. The pain started today while she was at work. This is a new problem. She is unsure if she just over did it while working. Sitting makes the pain better. The pain feels like something is pulling her abdominal muscles apart.  She has no bleeding, no leaking of fluid. No urinary complaints.   OB History     Gravida  1   Para      Term      Preterm      AB      Living         SAB      IAB      Ectopic      Multiple      Live Births              Past Medical History:  Diagnosis Date   GERD (gastroesophageal reflux disease)     Past Surgical History:  Procedure Laterality Date   KNEE SURGERY     TONSILLECTOMY      Family History  Problem Relation Age of Onset   Diabetes Maternal Grandmother    Breast cancer Paternal Grandmother     Social History   Tobacco Use   Smoking status: Never   Smokeless tobacco: Never  Vaping Use   Vaping Use: Former  Substance Use Topics   Alcohol use: Not Currently   Drug use: Never    Allergies: No Known Allergies  Medications Prior to Admission  Medication Sig Dispense Refill Last Dose   Prenatal Vit-Fe Fumarate-FA (PRENATAL VITAMIN PO) Take by mouth.   09/05/2021   Blood Pressure Monitor MISC For regular home bp monitoring during pregnancy 1 each 0    Results for orders placed or performed during the hospital encounter of 09/05/21 (from the past 48 hour(s))  Urinalysis, Routine w reflex microscopic Urine, Clean Catch     Status: Abnormal   Collection Time: 09/05/21  3:32 PM  Result Value Ref Range   Color, Urine YELLOW YELLOW   APPearance HAZY (A) CLEAR   Specific Gravity,  Urine 1.017 1.005 - 1.030   pH 6.0 5.0 - 8.0   Glucose, UA NEGATIVE NEGATIVE mg/dL   Hgb urine dipstick NEGATIVE NEGATIVE   Bilirubin Urine NEGATIVE NEGATIVE   Ketones, ur NEGATIVE NEGATIVE mg/dL   Protein, ur NEGATIVE NEGATIVE mg/dL   Nitrite NEGATIVE NEGATIVE   Leukocytes,Ua TRACE (A) NEGATIVE   RBC / HPF 0-5 0 - 5 RBC/hpf   WBC, UA 0-5 0 - 5 WBC/hpf   Bacteria, UA RARE (A) NONE SEEN   Squamous Epithelial / LPF 11-20 0 - 5   Mucus PRESENT     Comment: Performed at [facility], [address], [place], [place] [address]    Review of Systems  Constitutional:  Negative for fever.  Gastrointestinal:  Negative for abdominal pain, diarrhea and nausea.  Genitourinary:  Positive for pelvic pain. Negative for dysuria, frequency and urgency.  Physical Exam   Blood pressure 115/65, pulse (!) 107, temperature 98 F (36.7 C), temperature source Oral, resp. rate 19, height 5\' 6"  (1.676 m),  weight 74.7 kg, last menstrual period 12/22/2020, SpO2 100 %.  Physical Exam Constitutional:      General: She is not in acute distress.    Appearance: Normal appearance. She is not ill-appearing, toxic-appearing or diaphoretic.  HENT:     Head: Normocephalic.  Abdominal:     Tenderness: There is no abdominal tenderness.     Hernia: No hernia is present.  Genitourinary:    Comments: Cervix: 1 cm, thick, posterior. Exam by: Venia Carbon, NP  Musculoskeletal:        General: Normal range of motion.  Skin:    General: Skin is warm.  Neurological:     Mental Status: She is alert and oriented to person, place, and time.   Fetal Tracing  Baseline: 125 bpm Variability: Moderate  Accelerations: 15x15 Decelerations: None Toco: None  MAU Course  Procedures  MDM UA with Urine culture.   Assessment and Plan   A:  1. Dislocation of symphysis pubis, initial encounter   2. [redacted] weeks gestation of pregnancy   3. Abdominal pain in pregnancy, third trimester     P:  Discharge home in  stable condition Return to MAU if symptoms worsen RX: Flexeril Warm compresses are fine Ok to use tylenol OTC as directed  Pregnancy support belt may be helpful.    Venia Carbon I, NP 09/05/2021, 4:04 PM

## 2021-09-05 NOTE — MAU Note (Signed)
Presents with c/o pelvic pain with walking that began today.  Denies dysuria, frequency and urgency with urination. Denies VB or LOF.  Endorses +FM.

## 2021-09-07 ENCOUNTER — Other Ambulatory Visit: Payer: Self-pay

## 2021-09-07 ENCOUNTER — Ambulatory Visit (INDEPENDENT_AMBULATORY_CARE_PROVIDER_SITE_OTHER): Payer: Medicaid Other | Admitting: Obstetrics & Gynecology

## 2021-09-07 ENCOUNTER — Encounter: Payer: Self-pay | Admitting: Obstetrics & Gynecology

## 2021-09-07 ENCOUNTER — Other Ambulatory Visit (HOSPITAL_COMMUNITY)
Admission: RE | Admit: 2021-09-07 | Discharge: 2021-09-07 | Disposition: A | Discharge status: Home / Self Care | Payer: Medicaid Other | Source: Ambulatory Visit | Attending: Obstetrics & Gynecology | Admitting: Obstetrics & Gynecology

## 2021-09-07 VITALS — BP 127/74 | HR 95 | Wt 165.0 lb

## 2021-09-07 DIAGNOSIS — Z3A37 37 weeks gestation of pregnancy: Secondary | ICD-10-CM

## 2021-09-07 DIAGNOSIS — Z3403 Encounter for supervision of normal first pregnancy, third trimester: Secondary | ICD-10-CM

## 2021-09-07 DIAGNOSIS — Z3A36 36 weeks gestation of pregnancy: Secondary | ICD-10-CM | POA: Insufficient documentation

## 2021-09-07 DIAGNOSIS — Z3493 Encounter for supervision of normal pregnancy, unspecified, third trimester: Secondary | ICD-10-CM | POA: Diagnosis not present

## 2021-09-07 LAB — CULTURE, OB URINE: Culture: 80000 — AB

## 2021-09-07 LAB — OB RESULTS CONSOLE GC/CHLAMYDIA: Gonorrhea: NEGATIVE

## 2021-09-07 NOTE — Progress Notes (Signed)
   LOW-RISK PREGNANCY VISIT Patient name: Meghan Mitchell MRN [id number]  Date of birth: August 25, 2001 Chief Complaint:   Routine Prenatal Visit  History of Present Illness:   Meghan Mitchell is a 20 y.o. G1P0 female at [redacted]w[redacted]d with an Estimated Date of Delivery: 09/28/21 being seen today for ongoing management of a low-risk pregnancy.  Depression screen [facility] 2/9 07/01/2021 05/05/2021  Decreased Interest 0 0  Down, Depressed, Hopeless 0 0  PHQ - 2 Score 0 0  Altered sleeping 0 1  Tired, decreased energy 0 1  Change in appetite 0 1  Feeling bad or failure about yourself  0 0  Trouble concentrating 0 0  Moving slowly or fidgety/restless 0 0  Suicidal thoughts 0 0  PHQ-9 Score 0 3    Today she reports no complaints. Contractions: Irritability. Vag. Bleeding: None.  Movement: Present. denies leaking of fluid. Review of Systems:   Pertinent items are noted in HPI Denies abnormal vaginal discharge w/ itching/odor/irritation, headaches, visual changes, shortness of breath, chest pain, abdominal pain, severe nausea/vomiting, or problems with urination or bowel movements unless otherwise stated above. Pertinent History Reviewed:  Reviewed past medical,surgical, social, obstetrical and family history.  Reviewed problem list, medications and allergies. Physical Assessment:   Vitals:   09/07/21 1554  BP: 127/74  Pulse: 95  Weight: 165 lb (74.8 kg)  Body mass index is 26.63 kg/m.        Physical Examination:   General appearance: Well appearing, and in no distress  Mental status: Alert, oriented to person, place, and time  Skin: Warm & dry  Cardiovascular: Normal heart rate noted  Respiratory: Normal respiratory effort, no distress  Abdomen: Soft, gravid, nontender  Pelvic: Cervical exam performed  Dilation: 2.5 Effacement (%): 20 Station: -2  Extremities: Edema: None  Fetal Status:     Movement: Present Presentation: Vertex  Chaperone:  Statistician     No results found for this or any  previous visit (from the past 24 hour(s)).  Assessment & Plan:  1) Low-risk pregnancy G1P0 at [redacted]w[redacted]d with an Estimated Date of Delivery: 09/28/21    Meds: No orders of the defined types were placed in this encounter.  Labs/procedures today:   Plan:  Continue routine obstetrical care  Next visit: prefers in person    Reviewed: Term labor symptoms and general obstetric precautions including but not limited to vaginal bleeding, contractions, leaking of fluid and fetal movement were reviewed in detail with the patient.  All questions were answered. Has home bp cuff. Rx faxed to . Check bp weekly, let us know if >140/90.   Follow-up: Return in about 9 days (around 09/16/2021) for LROB.  No orders of the defined types were placed in this encounter.   Lazaro Arms, MD 09/07/2021 4:38 PM

## 2021-09-09 LAB — CERVICOVAGINAL ANCILLARY ONLY
Chlamydia: NEGATIVE
Comment: NEGATIVE
Comment: NORMAL
Neisseria Gonorrhea: NEGATIVE

## 2021-09-13 ENCOUNTER — Encounter: Payer: Self-pay | Admitting: Obstetrics & Gynecology

## 2021-09-16 ENCOUNTER — Telehealth (INDEPENDENT_AMBULATORY_CARE_PROVIDER_SITE_OTHER): Payer: Medicaid Other | Admitting: Obstetrics & Gynecology

## 2021-09-16 ENCOUNTER — Encounter: Payer: Self-pay | Admitting: Obstetrics & Gynecology

## 2021-09-16 ENCOUNTER — Telehealth: Payer: Self-pay | Admitting: *Deleted

## 2021-09-16 VITALS — BP 117/75 | HR 85

## 2021-09-16 DIAGNOSIS — Z3403 Encounter for supervision of normal first pregnancy, third trimester: Secondary | ICD-10-CM | POA: Diagnosis not present

## 2021-09-16 DIAGNOSIS — R8271 Bacteriuria: Secondary | ICD-10-CM

## 2021-09-16 DIAGNOSIS — O0930 Supervision of pregnancy with insufficient antenatal care, unspecified trimester: Secondary | ICD-10-CM

## 2021-09-16 DIAGNOSIS — F1290 Cannabis use, unspecified, uncomplicated: Secondary | ICD-10-CM

## 2021-09-16 NOTE — Progress Notes (Signed)
° °  TELEHEALTH VIRTUAL OBSTETRICS VISIT ENCOUNTER NOTE Patient name: Meghan Mitchell MRN [id number]  Date of birth: Feb 13, 2001  I connected with patient on 09/16/21 at 10:50 AM EST by phone and verified that I am speaking with the correct person using two identifiers. Pt is not currently in our office, she is at home.  The provider is in the office.    I discussed the limitations, risks, security and privacy concerns of performing an evaluation and management service by telephone and the availability of in person appointments. I also discussed with the patient that there may be a patient responsible charge related to this service. The patient expressed understanding and agreed to proceed.  Chief Complaint:   No chief complaint on file.  History of Present Illness:   Meghan Mitchell is a 20 y.o. G1P0 female at [redacted]w[redacted]d with an Estimated Date of Delivery: 09/28/21 being evaluated today for ongoing management of a low-risk pregnancy.  Depression screen [facility] 2/9 07/01/2021 05/05/2021  Decreased Interest 0 0  Down, Depressed, Hopeless 0 0  PHQ - 2 Score 0 0  Altered sleeping 0 1  Tired, decreased energy 0 1  Change in appetite 0 1  Feeling bad or failure about yourself  0 0  Trouble concentrating 0 0  Moving slowly or fidgety/restless 0 0  Suicidal thoughts 0 0  PHQ-9 Score 0 3    Today she reports no complaints. Contractions: Not present.  .  Movement: Present. denies leaking of fluid. Review of Systems:   Pertinent items are noted in HPI Denies abnormal vaginal discharge w/ itching/odor/irritation, headaches, visual changes, shortness of breath, chest pain, abdominal pain, severe nausea/vomiting, or problems with urination or bowel movements unless otherwise stated above. Pertinent History Reviewed:  Reviewed past medical,surgical, social, obstetrical and family history.  Reviewed problem list, medications and allergies. Physical Assessment:   Vitals:   09/16/21 1212  BP: 117/75  Pulse: 85   There is no height or weight on file to calculate BMI.        Physical Examination:   General:  Alert, oriented and cooperative.   Mental Status: Normal mood and affect perceived. Normal judgment and thought content.  Rest of physical exam deferred due to type of encounter  No results found for this or any previous visit (from the past 24 hour(s)).  Assessment & Plan:  1) Pregnancy G1P0 at [redacted]w[redacted]d with an Estimated Date of Delivery: 09/28/21   -low risk pregnancy   Meds: No orders of the defined types were placed in this encounter.   Labs/procedures today: none  Plan:  Continue routine obstetrical care  Next visit: prefers online    Reviewed: Term labor symptoms and general obstetric precautions including but not limited to vaginal bleeding, contractions, leaking of fluid and fetal movement were reviewed in detail with the patient. The patient was advised to call back or seek an in-person office evaluation/go to MAU at [facility] for any urgent or concerning symptoms. All questions were answered. Please refer to After Visit Summary for other counseling recommendations.    I provided 10 minutes of non-face-to-face time during this encounter.  Follow-up: Return in about 1 week (around 09/23/2021) for LROB visit.  No orders of the defined types were placed in this encounter.  Myna Hidalgo, DO Attending Obstetrician & Gynecologist, [facility] for [employer], [facility]

## 2021-09-16 NOTE — Telephone Encounter (Signed)
Pt scheduled for in person OB visit in 1 week per Dr. Charlotta Newton. JSY

## 2021-09-18 ENCOUNTER — Encounter (HOSPITAL_COMMUNITY): Payer: Self-pay | Admitting: Obstetrics and Gynecology

## 2021-09-18 ENCOUNTER — Other Ambulatory Visit: Payer: Self-pay

## 2021-09-18 ENCOUNTER — Inpatient Hospital Stay (HOSPITAL_COMMUNITY)
Admission: AD | Admit: 2021-09-18 | Discharge: 2021-09-18 | Disposition: A | Discharge status: Home / Self Care | Payer: Medicaid Other | Attending: Obstetrics and Gynecology | Admitting: Obstetrics and Gynecology

## 2021-09-18 DIAGNOSIS — Z3689 Encounter for other specified antenatal screening: Secondary | ICD-10-CM | POA: Insufficient documentation

## 2021-09-18 DIAGNOSIS — O368130 Decreased fetal movements, third trimester, not applicable or unspecified: Secondary | ICD-10-CM | POA: Diagnosis not present

## 2021-09-18 DIAGNOSIS — Z3A38 38 weeks gestation of pregnancy: Secondary | ICD-10-CM | POA: Diagnosis not present

## 2021-09-18 DIAGNOSIS — Z3403 Encounter for supervision of normal first pregnancy, third trimester: Secondary | ICD-10-CM

## 2021-09-18 DIAGNOSIS — F1290 Cannabis use, unspecified, uncomplicated: Secondary | ICD-10-CM

## 2021-09-18 DIAGNOSIS — O0930 Supervision of pregnancy with insufficient antenatal care, unspecified trimester: Secondary | ICD-10-CM

## 2021-09-18 DIAGNOSIS — R8271 Bacteriuria: Secondary | ICD-10-CM

## 2021-09-18 NOTE — MAU Provider Note (Signed)
History     CSN: [id number]  Arrival date and time: 09/18/21 1722     Chief Complaint  Patient presents with   Contractions   Decreased Fetal Movement   Ms. Meghan Mitchell is a 20 y.o. year old G1P0 female at [redacted]w[redacted]d weeks gestation who presents to MAU reporting contractions every 10-15 minutes and DFM. She reports she has only felt 2 movements today, but "he has been moving all over the place since the moment I stepped in this hospital." She also reports feeling a gush of fluid 2 days ago, but none since then. She states, "I don't think my water broke." She was dx'd with COVID-19 on 09/13/2021. She reports her sx's have subsided. The FOB is present and contributing to the history taking.    OB History     Gravida  1   Para      Term      Preterm      AB      Living         SAB      IAB      Ectopic      Multiple      Live Births              Past Medical History:  Diagnosis Date   GERD (gastroesophageal reflux disease)     Past Surgical History:  Procedure Laterality Date   KNEE SURGERY     TONSILLECTOMY      Family History  Problem Relation Age of Onset   Diabetes Maternal Grandmother    Breast cancer Paternal Grandmother     Social History   Tobacco Use   Smoking status: Never   Smokeless tobacco: Never  Vaping Use   Vaping Use: Former  Substance Use Topics   Alcohol use: Not Currently   Drug use: Never    Allergies: No Known Allergies  Medications Prior to Admission  Medication Sig Dispense Refill Last Dose   Prenatal Vit-Fe Fumarate-FA (PRENATAL VITAMIN PO) Take by mouth.   09/17/2021   Blood Pressure Monitor MISC For regular home bp monitoring during pregnancy 1 each 0    cyclobenzaprine (FLEXERIL) 10 MG tablet Take 1 tablet (10 mg total) by mouth 3 (three) times daily as needed for muscle spasms. 30 tablet 0     Review of Systems  Constitutional: Negative.   HENT:  Positive for congestion (mild) and rhinorrhea.   Eyes:  Negative.   Respiratory: Negative.    Cardiovascular: Negative.   Gastrointestinal: Negative.   Endocrine: Negative.   Genitourinary:  Positive for pelvic pain (contractions every 10-15 mins).       DFM today; "only felt movement twice"  Musculoskeletal: Negative.   Skin: Negative.   Allergic/Immunologic: Negative.   Neurological: Negative.   Hematological: Negative.   Psychiatric/Behavioral: Negative.    Physical Exam   Blood pressure 118/72, pulse (!) 107, temperature 98.3 F (36.8 C), temperature source Oral, resp. rate 17, last menstrual period 12/22/2020, SpO2 98 %.  Physical Exam Vitals and nursing note reviewed.  Constitutional:      Appearance: Normal appearance. She is normal weight.  Cardiovascular:     Rate and Rhythm: Tachycardia present.  Pulmonary:     Effort: Pulmonary effort is normal.  Abdominal:     Palpations: Abdomen is soft.  Genitourinary:    Comments: Deferred -- SVE by RN Skin:    General: Skin is warm and dry.  Neurological:     Mental Status: She is  alert and oriented to person, place, and time.  Psychiatric:        Mood and Affect: Mood normal.        Behavior: Behavior normal.        Thought Content: Thought content normal.        Judgment: Judgment normal.   Dilation: 2 Effacement (%): Thick Cervical Position: Posterior Station: -2 Presentation: Vertex Exam by:: Holly Flippin RN  REACTIVE NST - FHR: 130 bpm / moderate variability / accels present / decels absent / TOCO: Occ UC with UI noted MAU Course  Procedures  MDM CEFM RN Labor check  Assessment and Plan  NST (non-stress test) reactive  - Reassurance given that baby has been reactive on the monitor since being in MAU - Information provided on [facility]  [redacted] weeks gestation of pregnancy   - Discharge patient - Keep scheduled appt with FT on 09/23/21 - Patient verbalized an understanding of the plan of care and agrees.    Raelyn Mora, CNM 09/18/2021, 7:39 PM

## 2021-09-18 NOTE — MAU Note (Signed)
.  Meghan Mitchell is a 20 y.o. at [redacted]w[redacted]d here in MAU reporting: ctx every 10-15 minutes apart, denies vb. Had a gush of fluid x2 days ago but none since. Does not think her water is broken. States DFM today, baby has only moved twice. Positive for COVID on 12/12.   FHT:142

## 2021-09-22 ENCOUNTER — Encounter (HOSPITAL_COMMUNITY): Payer: Self-pay

## 2021-09-22 ENCOUNTER — Emergency Department (HOSPITAL_COMMUNITY): Payer: Medicaid Other

## 2021-09-22 ENCOUNTER — Emergency Department (HOSPITAL_COMMUNITY)
Admission: EM | Admit: 2021-09-22 | Discharge: 2021-09-23 | Disposition: A | Discharge status: Home / Self Care | Payer: Medicaid Other | Attending: Emergency Medicine | Admitting: Emergency Medicine

## 2021-09-22 ENCOUNTER — Other Ambulatory Visit: Payer: Self-pay

## 2021-09-22 DIAGNOSIS — H538 Other visual disturbances: Secondary | ICD-10-CM | POA: Diagnosis not present

## 2021-09-22 DIAGNOSIS — R519 Headache, unspecified: Secondary | ICD-10-CM | POA: Insufficient documentation

## 2021-09-22 DIAGNOSIS — Z5321 Procedure and treatment not carried out due to patient leaving prior to being seen by health care provider: Secondary | ICD-10-CM | POA: Diagnosis not present

## 2021-09-22 NOTE — ED Triage Notes (Signed)
Pt arrived via POV for a HA and the pain moved into her eyes. Pt states, "my head felt like it was going to explode and my eyes felt like they were going to pop out of my head." Pt is [redacted] wks pregnant. Pt states took bp at home and it was 138/93 and pulse 106. Her dr told her to come here. Pt denies N/V, but states at first her vision was "spotty and she saw black dots," but has since resolved. VSS

## 2021-09-22 NOTE — ED Notes (Signed)
Patient left on own accord °

## 2021-09-22 NOTE — ED Provider Notes (Signed)
Emergency Medicine Provider Triage Evaluation Note  Meghan Mitchell , a 20 y.o. female  was evaluated in triage.  Pt complains of thunderclap type headache around 3 PM today while at work.  She describes acute onset of generalized headache.  No loss of vision but saw some black spots in her vision temporarily.  No weakness, numbness, or tingling in her arms or legs.  No vomiting, confusion, neck pain.  She took Tylenol.  She is [redacted] weeks pregnant.  Pain initially 9/10, currently 6/10.   Review of Systems  Positive: Severe headache Negative: Vomiting  Physical Exam  BP 120/79    Pulse (!) 107    Temp 98.2 F (36.8 C) (Oral)    Resp 16    Ht 5\' 6"  (1.676 m)    Wt 74.8 kg    LMP 12/22/2020 (Exact Date)    SpO2 100%    BMI 26.62 kg/m  Gen:   Awake, no distress   Resp:  Normal effort  MSK:   Moves extremities without difficulty  Other:  Gross normal neuro exam  Medical Decision Making  Medically screening exam initiated at 6:13 PM.  Appropriate orders placed.  Meghan Mitchell was informed that the remainder of the evaluation will be completed by another provider, this initial triage assessment does not replace that evaluation, and the importance of remaining in the ED until their evaluation is complete.     Shelda Altes, PA-C 09/22/21 1814    09/24/21, DO 09/22/21 2149

## 2021-09-23 ENCOUNTER — Ambulatory Visit (INDEPENDENT_AMBULATORY_CARE_PROVIDER_SITE_OTHER): Payer: Medicaid Other | Admitting: Obstetrics & Gynecology

## 2021-09-23 ENCOUNTER — Encounter: Payer: Self-pay | Admitting: Obstetrics & Gynecology

## 2021-09-23 VITALS — BP 114/66 | HR 85 | Wt 166.0 lb

## 2021-09-23 DIAGNOSIS — Z3403 Encounter for supervision of normal first pregnancy, third trimester: Secondary | ICD-10-CM

## 2021-09-23 DIAGNOSIS — Z3A39 39 weeks gestation of pregnancy: Secondary | ICD-10-CM

## 2021-09-23 NOTE — Progress Notes (Signed)
° °  LOW-RISK PREGNANCY VISIT Patient name: Meghan Mitchell MRN [id number]  Date of birth: May 23, 2001 Chief Complaint:   Routine Prenatal Visit  History of Present Illness:   Meghan Mitchell is a 20 y.o. G1P0 female at [redacted]w[redacted]d with an Estimated Date of Delivery: 09/28/21 being seen today for ongoing management of a low-risk pregnancy.  Depression screen [facility] 2/9 07/01/2021 05/05/2021  Decreased Interest 0 0  Down, Depressed, Hopeless 0 0  PHQ - 2 Score 0 0  Altered sleeping 0 1  Tired, decreased energy 0 1  Change in appetite 0 1  Feeling bad or failure about yourself  0 0  Trouble concentrating 0 0  Moving slowly or fidgety/restless 0 0  Suicidal thoughts 0 0  PHQ-9 Score 0 3    Today she reports no complaints. Contractions: Not present. Vag. Bleeding: None.  Movement: Present. denies leaking of fluid. Review of Systems:   Pertinent items are noted in HPI Denies abnormal vaginal discharge w/ itching/odor/irritation, headaches, visual changes, shortness of breath, chest pain, abdominal pain, severe nausea/vomiting, or problems with urination or bowel movements unless otherwise stated above. Pertinent History Reviewed:  Reviewed past medical,surgical, social, obstetrical and family history.  Reviewed problem list, medications and allergies. Physical Assessment:   Vitals:   09/23/21 0955  BP: 114/66  Pulse: 85  Weight: 166 lb (75.3 kg)  Body mass index is 26.79 kg/m.        Physical Examination:   General appearance: Well appearing, and in no distress  Mental status: Alert, oriented to person, place, and time  Skin: Warm & dry  Cardiovascular: Normal heart rate noted  Respiratory: Normal respiratory effort, no distress  Abdomen: Soft, gravid, nontender  Pelvic: Cervical exam performed  Dilation: 3 Effacement (%): 50 Station: -2  Extremities: Edema: None  Fetal Status: Fetal Heart Rate (bpm): 135 Fundal Height: 33 cm Movement: Present Presentation: Vertex  Chaperone: Latisha  Cresenzo    No results found for this or any previous visit (from the past 24 hour(s)).  Assessment & Plan:  1) Low-risk pregnancy G1P0 at [redacted]w[redacted]d with an Estimated Date of Delivery: 09/28/21   2) Recent COVID, recovered,    Meds: No orders of the defined types were placed in this encounter.  Labs/procedures today:   Plan:  Continue routine obstetrical care  Next visit: prefers in person    Reviewed: Term labor symptoms and general obstetric precautions including but not limited to vaginal bleeding, contractions, leaking of fluid and fetal movement were reviewed in detail with the patient.  All questions were answered. Has home bp cuff. Rx faxed to . Check bp weekly, let us know if >140/90.   Follow-up: Return in about 1 week (around 09/30/2021) for NST.  No orders of the defined types were placed in this encounter.   Lazaro Arms, MD 09/23/2021 10:08 AM

## 2021-09-24 ENCOUNTER — Other Ambulatory Visit: Payer: Self-pay

## 2021-09-24 ENCOUNTER — Inpatient Hospital Stay (HOSPITAL_COMMUNITY)
Admission: AD | Admit: 2021-09-24 | Discharge: 2021-09-26 | DRG: 806 | Disposition: A | Discharge status: Home / Self Care | Payer: Medicaid Other | Attending: Obstetrics and Gynecology | Admitting: Obstetrics and Gynecology

## 2021-09-24 ENCOUNTER — Encounter (HOSPITAL_COMMUNITY): Payer: Self-pay | Admitting: Obstetrics and Gynecology

## 2021-09-24 ENCOUNTER — Inpatient Hospital Stay (EMERGENCY_DEPARTMENT_HOSPITAL)
Admission: AD | Admit: 2021-09-24 | Discharge: 2021-09-24 | Disposition: A | Discharge status: Home / Self Care | Payer: Medicaid Other | Source: Home / Self Care | Attending: Obstetrics and Gynecology | Admitting: Obstetrics and Gynecology

## 2021-09-24 DIAGNOSIS — O99324 Drug use complicating childbirth: Secondary | ICD-10-CM | POA: Diagnosis not present

## 2021-09-24 DIAGNOSIS — O99824 Streptococcus B carrier state complicating childbirth: Secondary | ICD-10-CM | POA: Diagnosis present

## 2021-09-24 DIAGNOSIS — Z3A39 39 weeks gestation of pregnancy: Secondary | ICD-10-CM | POA: Diagnosis not present

## 2021-09-24 DIAGNOSIS — F1290 Cannabis use, unspecified, uncomplicated: Secondary | ICD-10-CM

## 2021-09-24 DIAGNOSIS — O99820 Streptococcus B carrier state complicating pregnancy: Secondary | ICD-10-CM | POA: Diagnosis not present

## 2021-09-24 DIAGNOSIS — O479 False labor, unspecified: Secondary | ICD-10-CM | POA: Diagnosis not present

## 2021-09-24 DIAGNOSIS — Z3403 Encounter for supervision of normal first pregnancy, third trimester: Secondary | ICD-10-CM

## 2021-09-24 DIAGNOSIS — R8271 Bacteriuria: Secondary | ICD-10-CM

## 2021-09-24 DIAGNOSIS — O471 False labor at or after 37 completed weeks of gestation: Secondary | ICD-10-CM | POA: Insufficient documentation

## 2021-09-24 DIAGNOSIS — O26893 Other specified pregnancy related conditions, third trimester: Secondary | ICD-10-CM | POA: Diagnosis present

## 2021-09-24 DIAGNOSIS — O0930 Supervision of pregnancy with insufficient antenatal care, unspecified trimester: Secondary | ICD-10-CM

## 2021-09-24 LAB — CBC
HCT: 33.9 % — ABNORMAL LOW (ref 36.0–46.0)
Hemoglobin: 11.1 g/dL — ABNORMAL LOW (ref 12.0–15.0)
MCH: 27.1 pg (ref 26.0–34.0)
MCHC: 32.7 g/dL (ref 30.0–36.0)
MCV: 82.9 fL (ref 80.0–100.0)
Platelets: 324 10*3/uL (ref 150–400)
RBC: 4.09 MIL/uL (ref 3.87–5.11)
RDW: 14.6 % (ref 11.5–15.5)
WBC: 25.1 10*3/uL — ABNORMAL HIGH (ref 4.0–10.5)
nRBC: 0 % (ref 0.0–0.2)

## 2021-09-24 LAB — TYPE AND SCREEN
ABO/RH(D): AB POS
Antibody Screen: NEGATIVE

## 2021-09-24 MED ORDER — LIDOCAINE HCL (PF) 1 % IJ SOLN: 30.0000 mL | INTRAMUSCULAR | Status: DC | PRN

## 2021-09-24 MED ORDER — OXYTOCIN BOLUS FROM INFUSION
333.0000 mL | Freq: Once | INTRAVENOUS | Status: AC
  Administered 2021-09-25: 06:00:00 333 mL via INTRAVENOUS

## 2021-09-24 MED ORDER — ACETAMINOPHEN 325 MG PO TABS: 650.0000 mg | ORAL_TABLET | ORAL | Status: DC | PRN

## 2021-09-24 MED ORDER — SODIUM CHLORIDE 0.9 % IV SOLN
5.0000 10*6.[IU] | Freq: Once | INTRAVENOUS | Status: AC
  Administered 2021-09-24: 22:00:00 5 10*6.[IU] via INTRAVENOUS
  Filled 2021-09-24: qty 5

## 2021-09-24 MED ORDER — PENICILLIN G POT IN DEXTROSE 60000 UNIT/ML IV SOLN
3.0000 10*6.[IU] | INTRAVENOUS | Status: DC
  Administered 2021-09-25: 02:00:00 3 10*6.[IU] via INTRAVENOUS
  Filled 2021-09-24 (×2): qty 50

## 2021-09-24 MED ORDER — FENTANYL CITRATE (PF) 100 MCG/2ML IJ SOLN
100.0000 ug | INTRAMUSCULAR | Status: DC | PRN
  Administered 2021-09-25 (×2): 100 ug via INTRAVENOUS
  Filled 2021-09-24 (×2): qty 2

## 2021-09-24 MED ORDER — ONDANSETRON HCL 4 MG/2ML IJ SOLN
4.0000 mg | Freq: Four times a day (QID) | INTRAMUSCULAR | Status: DC | PRN
  Administered 2021-09-25: 01:00:00 4 mg via INTRAVENOUS
  Filled 2021-09-24: qty 2

## 2021-09-24 MED ORDER — LACTATED RINGERS IV SOLN
500.0000 mL | INTRAVENOUS | Status: DC | PRN
Start: 2021-09-24 — End: 2021-09-25
  Administered 2021-09-25: 05:00:00 1000 mL via INTRAVENOUS

## 2021-09-24 MED ORDER — SOD CITRATE-CITRIC ACID 500-334 MG/5ML PO SOLN: 30.0000 mL | ORAL | Status: DC | PRN

## 2021-09-24 MED ORDER — LACTATED RINGERS IV SOLN: INTRAVENOUS | Status: DC

## 2021-09-24 MED ORDER — OXYTOCIN-SODIUM CHLORIDE 30-0.9 UT/500ML-% IV SOLN
2.5000 [IU]/h | INTRAVENOUS | Status: DC
  Filled 2021-09-24: qty 500

## 2021-09-24 NOTE — Progress Notes (Signed)
S: Patient overall but having painful contractions, but still coping well. Significant other at bedside and supportive. Plans no epidural but open to IV pain medication when needed. Anticipating arrival of baby boy [place].   O: Vitals:   09/24/21 2041 09/24/21 2200  BP: 106/66 123/71  Pulse: (!) 118 95  Resp: 18 17  Temp: 98.2 F (36.8 C) 98.3 F (36.8 C)  TempSrc: Oral Oral  SpO2: 98%   Weight: 75.4 kg   Height: 5\' 6"  (1.676 m)     FHT:  FHR: 145 bpm, variability: moderate,  accelerations:  Present,  decelerations:  Absent UC:   irregular, every 2-5 minutes SVE:   Dilation: 5 Effacement (%): 80 Station: -1 Exam by:: Saliyah Gillin SNM  A / P: 20 y.o. G1P0 [redacted]w[redacted]d SOL  Plan to recheck cervix after 2nd dose of PCN and possible AROM vs Pitocin for labor augmentation  GBS positive  Fetal Wellbeing:  Category I Pain Control:  Labor support without medications and IV pain meds Anticipated MOD:  NSVD  [redacted]w[redacted]d, SNM 09/24/2021, 11:30 PM

## 2021-09-24 NOTE — MAU Note (Signed)
Presents with c/o ctxs every 4 minutes that began this morning 0615.  Denies LOF, reports small amount VB but had membranes stripped yesterday.  Endorses +FM.

## 2021-09-24 NOTE — MAU Provider Note (Signed)
Meghan Mitchell is a 20 y.o. G1P0 female at [redacted]w[redacted]d.  RN labor check, not seen by provider.  SVE by RN: Dilation: 4.5 Effacement (%): 80 Station: -2 Exam by:: Erle Crocker, RN  NST: FHR baseline 135 bpm, Variability: moderate, Accelerations:present, Decelerations:  Absent= Cat 1/Reactive Toco: Regular, every 6 minutes   Assessment and Plan: - SVE unchanged upon recheck - Not in active labor  - Cat 1, reactive NST - Stable for discharge with labor precautions   Worthy Rancher, MD  09/24/2021 11:03 AM

## 2021-09-24 NOTE — MAU Note (Signed)
..  Meghan Mitchell is a 20 y.o. at [redacted]w[redacted]d here in MAU reporting: contractions that are 3 minutes apart. Denies vaginal bleeding or leaking of fluid. +FM  Pain score: 9/10 Vitals:   09/24/21 2041  BP: 106/66  Pulse: (!) 118  Resp: 18  Temp: 98.2 F (36.8 C)  SpO2: 98%     FHT:155

## 2021-09-24 NOTE — H&P (Signed)
OBSTETRIC ADMISSION HISTORY AND PHYSICAL  Meghan Mitchell is a 20 y.o. female G1P0 with IUP at [redacted]w[redacted]d by L=14 presenting for SOL. She reports +FMs, No LOF, no VB, no blurry vision, headaches or peripheral edema, and RUQ pain.  She plans on breast feeding. She request POP for birth control. She received her prenatal care at [facility]   Dating: By L=14 --->  Estimated Date of Delivery: 09/28/21  Sono:    @[redacted]w[redacted]d , CWD, normal anatomy, cephalic presentation, 2862g, EFW   Prenatal History/Complications:  Late Prenatal Care - presented for care at 19w GBS Bactiuria Marijuana use in pregnancy  Past Medical History: Past Medical History:  Diagnosis Date   GERD (gastroesophageal reflux disease)     Past Surgical History: Past Surgical History:  Procedure Laterality Date   KNEE SURGERY     TONSILLECTOMY      Obstetrical History: OB History     Gravida  1   Para      Term      Preterm      AB      Living         SAB      IAB      Ectopic      Multiple      Live Births              Social History Social History   Socioeconomic History   Marital status: Single    Spouse name: Not on file   Number of children: Not on file   Years of education: Not on file   Highest education level: Not on file  Occupational History   Not on file  Tobacco Use   Smoking status: Never   Smokeless tobacco: Never  Vaping Use   Vaping Use: Former  Substance and Sexual Activity   Alcohol use: Not Currently   Drug use: Never   Sexual activity: Yes    Birth control/protection: None  Other Topics Concern   Not on file  Social History Narrative   Not on file   Social Determinants of Health   Financial Resource Strain: Low Risk    Difficulty of Paying Living Expenses: Not hard at all  Food Insecurity: No Food Insecurity   Worried About 16% in the Last Year: Never true   Ran Out of Food in the Last Year: Never true  Transportation Needs: No  Transportation Needs   Lack of Transportation (Medical): No   Lack of Transportation (Non-Medical): No  Physical Activity: Insufficiently Active   Days of Exercise per Week: 2 days   Minutes of Exercise per Session: 20 min  Stress: No Stress Concern Present   Feeling of Stress : Not at all  Social Connections: Moderately Isolated   Frequency of Communication with Friends and Family: Three times a week   Frequency of Social Gatherings with Friends and Family: Once a week   Attends Religious Services: Never   Programme researcher, broadcasting/film/video or Organizations: No   Attends Database administrator: Never   Marital Status: Living with partner    Family History: Family History  Problem Relation Age of Onset   Diabetes Maternal Grandmother    Breast cancer Paternal Grandmother     Allergies: No Known Allergies  Medications Prior to Admission  Medication Sig Dispense Refill Last Dose   Blood Pressure Monitor MISC For regular home bp monitoring during pregnancy 1 each 0    cyclobenzaprine (FLEXERIL) 10 MG tablet  Take 1 tablet (10 mg total) by mouth 3 (three) times daily as needed for muscle spasms. (Patient not taking: Reported on 09/23/2021) 30 tablet 0    Prenatal Vit-Fe Fumarate-FA (PRENATAL VITAMIN PO) Take by mouth.        Review of Systems   All systems reviewed and negative except as stated in HPI  Blood pressure 106/66, pulse (!) 118, temperature 98.2 F (36.8 C), temperature source Oral, resp. rate 18, height 5\' 6"  (1.676 m), weight 75.4 kg, last menstrual period 12/22/2020, SpO2 98 %. General appearance: alert, appears stated age, and moderate distress Lungs: clear to auscultation bilaterally Heart: regular rate and rhythm Abdomen: soft, non-tender; bowel sounds normal Pelvic: 5.5 cm per nurse in MAU Extremities: Homans sign is negative, no sign of DVT  Presentation: cephalic Fetal monitoringBaseline: 150 bpm, Variability: Good {> 6 bpm), Accelerations: Reactive, and  Decelerations: Variable: mild and intermittent Uterine activity: Regular ctxns Q2-3 min Dilation: 5.5 Effacement (%): 80 Station: -1 Exam by:: [email or web address] RN   Prenatal labs: ABO, Rh: AB/Positive/-- (08/03 1213) Antibody: Negative (09/29 0829) Rubella: 1.15 (08/03 1213) RPR: Non Reactive (09/29 0829)  HBsAg: Negative (08/03 1213)  HIV: Non Reactive (09/29 0829)  GBS:    2 hr Glucola normal Genetic screening  Declined Anatomy 09[birth year]1996: complete and normal  Prenatal Transfer Tool  Maternal Diabetes: No Genetic Screening: Declined Maternal Ultrasounds/Referrals: Normal Fetal Ultrasounds or other Referrals:  None Maternal Substance Abuse:  Yes:  Type: Marijuana Significant Maternal Medications:  None Significant Maternal Lab Results: Group B Strep positive  No results found for this or any previous visit (from the past 24 hour(s)).  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 G1P0 at [redacted]w[redacted]d here for SOL  #Labor: Spontaneous labor #Pain: Per request but plans unmedicated #FWB: Overall category 1, has occasional variable decels that are shallow with ctxns #ID:  GBS Pos - PCN in labor #MOF: breast #MOC: POP #Circ:  yes  [redacted]w[redacted]d, MD  09/24/2021, 9:01 PM

## 2021-09-25 ENCOUNTER — Inpatient Hospital Stay (HOSPITAL_COMMUNITY): Payer: Medicaid Other | Admitting: Anesthesiology

## 2021-09-25 ENCOUNTER — Encounter (HOSPITAL_COMMUNITY): Payer: Self-pay | Admitting: Obstetrics and Gynecology

## 2021-09-25 DIAGNOSIS — O99324 Drug use complicating childbirth: Secondary | ICD-10-CM

## 2021-09-25 DIAGNOSIS — O99820 Streptococcus B carrier state complicating pregnancy: Secondary | ICD-10-CM

## 2021-09-25 DIAGNOSIS — Z3A39 39 weeks gestation of pregnancy: Secondary | ICD-10-CM

## 2021-09-25 LAB — RPR: RPR Ser Ql: NONREACTIVE

## 2021-09-25 MED ORDER — EPHEDRINE 5 MG/ML INJ: 10.0000 mg | INTRAVENOUS | Status: DC | PRN

## 2021-09-25 MED ORDER — SENNOSIDES-DOCUSATE SODIUM 8.6-50 MG PO TABS
2.0000 | ORAL_TABLET | Freq: Every day | ORAL | Status: DC
  Administered 2021-09-26: 12:00:00 2 via ORAL
  Filled 2021-09-25: qty 2

## 2021-09-25 MED ORDER — DIPHENHYDRAMINE HCL 50 MG/ML IJ SOLN: 12.5000 mg | INTRAMUSCULAR | Status: DC | PRN

## 2021-09-25 MED ORDER — WITCH HAZEL-GLYCERIN EX PADS: 1.0000 "application " | MEDICATED_PAD | CUTANEOUS | Status: DC | PRN

## 2021-09-25 MED ORDER — TETANUS-DIPHTH-ACELL PERTUSSIS 5-2.5-18.5 LF-MCG/0.5 IM SUSY: 0.5000 mL | PREFILLED_SYRINGE | Freq: Once | INTRAMUSCULAR | Status: DC

## 2021-09-25 MED ORDER — PRENATAL MULTIVITAMIN CH
1.0000 | ORAL_TABLET | Freq: Every day | ORAL | Status: DC
  Administered 2021-09-25 – 2021-09-26 (×2): 1 via ORAL
  Filled 2021-09-25 (×2): qty 1

## 2021-09-25 MED ORDER — SIMETHICONE 80 MG PO CHEW: 80.0000 mg | CHEWABLE_TABLET | ORAL | Status: DC | PRN

## 2021-09-25 MED ORDER — COCONUT OIL OIL
1.0000 "application " | TOPICAL_OIL | Status: DC | PRN
  Administered 2021-09-25: 1 via TOPICAL

## 2021-09-25 MED ORDER — IBUPROFEN 600 MG PO TABS
600.0000 mg | ORAL_TABLET | Freq: Four times a day (QID) | ORAL | Status: DC
  Administered 2021-09-25 – 2021-09-26 (×5): 600 mg via ORAL
  Filled 2021-09-25 (×5): qty 1

## 2021-09-25 MED ORDER — FENTANYL-BUPIVACAINE-NACL 0.5-0.125-0.9 MG/250ML-% EP SOLN
EPIDURAL | Status: DC | PRN
  Administered 2021-09-25: 12 mL/h via EPIDURAL

## 2021-09-25 MED ORDER — BENZOCAINE-MENTHOL 20-0.5 % EX AERO
1.0000 "application " | INHALATION_SPRAY | CUTANEOUS | Status: DC | PRN
  Administered 2021-09-25: 1 via TOPICAL
  Filled 2021-09-25: qty 56

## 2021-09-25 MED ORDER — FENTANYL-BUPIVACAINE-NACL 0.5-0.125-0.9 MG/250ML-% EP SOLN: 12.0000 mL/h | EPIDURAL | Status: DC | PRN

## 2021-09-25 MED ORDER — LIDOCAINE HCL (PF) 1 % IJ SOLN
INTRAMUSCULAR | Status: DC | PRN
  Administered 2021-09-25: 10 mL via EPIDURAL
  Administered 2021-09-25: 2 mL via EPIDURAL

## 2021-09-25 MED ORDER — ZOLPIDEM TARTRATE 5 MG PO TABS: 5.0000 mg | ORAL_TABLET | Freq: Every evening | ORAL | Status: DC | PRN

## 2021-09-25 MED ORDER — PHENYLEPHRINE 40 MCG/ML (10ML) SYRINGE FOR IV PUSH (FOR BLOOD PRESSURE SUPPORT): 80.0000 ug | PREFILLED_SYRINGE | INTRAVENOUS | Status: DC | PRN

## 2021-09-25 MED ORDER — OXYCODONE HCL 5 MG PO TABS: 5.0000 mg | ORAL_TABLET | ORAL | Status: DC | PRN

## 2021-09-25 MED ORDER — ONDANSETRON HCL 4 MG PO TABS: 4.0000 mg | ORAL_TABLET | ORAL | Status: DC | PRN

## 2021-09-25 MED ORDER — ACETAMINOPHEN 325 MG PO TABS: 650.0000 mg | ORAL_TABLET | ORAL | Status: DC | PRN

## 2021-09-25 MED ORDER — LACTATED RINGERS IV SOLN
500.0000 mL | Freq: Once | INTRAVENOUS | Status: AC
  Administered 2021-09-25: 03:00:00 500 mL via INTRAVENOUS

## 2021-09-25 MED ORDER — ONDANSETRON HCL 4 MG/2ML IJ SOLN: 4.0000 mg | INTRAMUSCULAR | Status: DC | PRN

## 2021-09-25 MED ORDER — DIBUCAINE (PERIANAL) 1 % EX OINT: 1.0000 "application " | TOPICAL_OINTMENT | CUTANEOUS | Status: DC | PRN

## 2021-09-25 MED ORDER — FENTANYL-BUPIVACAINE-NACL 0.5-0.125-0.9 MG/250ML-% EP SOLN
EPIDURAL | Status: AC
  Filled 2021-09-25: qty 250

## 2021-09-25 MED ORDER — DIPHENHYDRAMINE HCL 25 MG PO CAPS: 25.0000 mg | ORAL_CAPSULE | Freq: Four times a day (QID) | ORAL | Status: DC | PRN

## 2021-09-25 NOTE — Anesthesia Postprocedure Evaluation (Signed)
Anesthesia Post Note  Patient: Meghan Mitchell  Procedure(s) Performed: AN AD HOC LABOR EPIDURAL     Patient location during evaluation: Mother Baby Anesthesia Type: Epidural Level of consciousness: awake and alert Pain management: pain level controlled Vital Signs Assessment: post-procedure vital signs reviewed and stable Respiratory status: spontaneous breathing, nonlabored ventilation and respiratory function stable Cardiovascular status: stable Postop Assessment: no headache, no backache and epidural receding Anesthetic complications: no   No notable events documented.  Last Vitals:  Vitals:   09/25/21 1003 09/25/21 1430  BP: 114/70 (!) 106/58  Pulse: 96 92  Resp: 20 18  Temp: 37 C 37.2 C  SpO2:      Last Pain:  Vitals:   09/25/21 1553  TempSrc:   PainSc: 6    Pain Goal: Patients Stated Pain Goal: 0 (09/25/21 0233)                 Mauricia Area

## 2021-09-25 NOTE — Anesthesia Procedure Notes (Addendum)
Epidural Patient location during procedure: OB Start time: 09/25/2021 2:48 AM End time: 09/25/2021 2:58 AM  Staffing Anesthesiologist: Lannie Fields, DO Performed: anesthesiologist   Preanesthetic Checklist Completed: patient identified, IV checked, risks and benefits discussed, monitors and equipment checked, pre-op evaluation and timeout performed  Epidural Patient position: sitting Prep: DuraPrep and site prepped and draped Patient monitoring: continuous pulse ox, blood pressure, heart rate and cardiac monitor Approach: midline Location: L3-L4 Injection technique: LOR air  Needle:  Needle type: Tuohy  Needle gauge: 17 G Needle length: 9 cm Needle insertion depth: 5 cm Catheter type: closed end flexible Catheter size: 19 Gauge Catheter at skin depth: 10 cm Test dose: negative  Assessment Sensory level: T8 Events: blood not aspirated, injection not painful, no injection resistance, no paresthesia and negative IV test  Additional Notes Patient identified. Risks/Benefits/Options discussed with patient including but not limited to bleeding, infection, nerve damage, paralysis, failed block, incomplete pain control, headache, blood pressure changes, nausea, vomiting, reactions to medication both or allergic, itching and postpartum back pain. Confirmed with bedside nurse the patient's most recent platelet count. Confirmed with patient that they are not currently taking any anticoagulation, have any bleeding history or any family history of bleeding disorders. Patient expressed understanding and wished to proceed. All questions were answered. Sterile technique was used throughout the entire procedure. Please see nursing notes for vital signs. Test dose was given through epidural catheter and negative prior to continuing to dose epidural or start infusion. Warning signs of high block given to the patient including shortness of breath, tingling/numbness in hands, complete motor  block, or any concerning symptoms with instructions to call for help. Patient was given instructions on fall risk and not to get out of bed. All questions and concerns addressed with instructions to call with any issues or inadequate analgesia.  Reason for block:procedure for pain

## 2021-09-25 NOTE — Progress Notes (Signed)
S: Patient comfortable now with her epidural. Discussed AROM and patient agrees.   O: Vitals:   09/25/21 0314 09/25/21 0315 09/25/21 0320 09/25/21 0330  BP:  114/62 120/77 127/72  Pulse:  89 95 90  Resp:      Temp:      TempSrc:      SpO2: 98%  99%   Weight:      Height:        FHT:  FHR: 135 bpm, variability: moderate,  accelerations:  Present,  decelerations:  Absent UC:   irregular, every 1-5 minutes SVE:   Dilation: 5.5 Effacement (%): 80 Station: -1 Exam by:: Jerold Yoss SNM  A / P: 20 y.o. G1P0 39wk4d SOL cervix unchanged  AROM for labor augmentation  GBS positive on PCN Fetal Wellbeing:  Category I Pain Control:  Epidural Anticipated MOD:  NSVD  Trellis Moment, SNM 09/25/2021, 3:41 AM

## 2021-09-25 NOTE — Anesthesia Preprocedure Evaluation (Signed)
Anesthesia Evaluation  Patient identified by MRN, date of birth, ID band Patient awake    Reviewed: Allergy & Precautions, Patient's Chart, lab work & pertinent test results  Airway Mallampati: II  TM Distance: >3 FB Neck ROM: Full    Dental no notable dental hx.    Pulmonary neg pulmonary ROS,    Pulmonary exam normal breath sounds clear to auscultation       Cardiovascular negative cardio ROS Normal cardiovascular exam Rhythm:Regular Rate:Normal     Neuro/Psych negative neurological ROS  negative psych ROS   GI/Hepatic GERD  Controlled,(+)       marijuana use,   Endo/Other  negative endocrine ROS  Renal/GU negative Renal ROS  negative genitourinary   Musculoskeletal negative musculoskeletal ROS (+)   Abdominal   Peds negative pediatric ROS (+)  Hematology negative hematology ROS (+) hct 33.9, plt 324   Anesthesia Other Findings   Reproductive/Obstetrics (+) Pregnancy                             Anesthesia Physical Anesthesia Plan  ASA: 2  Anesthesia Plan: Epidural   Post-op Pain Management:    Induction:   PONV Risk Score and Plan: 2  Airway Management Planned: Natural Airway  Additional Equipment: None  Intra-op Plan:   Post-operative Plan:   Informed Consent: I have reviewed the patients History and Physical, chart, labs and discussed the procedure including the risks, benefits and alternatives for the proposed anesthesia with the patient or authorized representative who has indicated his/her understanding and acceptance.       Plan Discussed with:   Anesthesia Plan Comments:         Anesthesia Quick Evaluation

## 2021-09-25 NOTE — Lactation Note (Addendum)
This note was copied from a baby's chart. Lactation Consultation Note  Patient Name: Meghan Mitchell [id number] Date: 09/25/2021   Age:21 hours  P1; Mom reports + breast changes w/pregnancy. Infant was unwrapped and placed skin-to-skin on Mom. Infant did not show any feeding cues, so hand expression was taught to Mom & the resulting drops were given to infant on a gloved finger. Good, functional suck noted.  Mom's nipple diameter suggests that she will need a size 21 flange for her pump at home. Her personal pump is going to be brought to hospital to see what size flanges she has.   Mom was made aware of O/P services, breastfeeding support groups, and our phone # for post-discharge questions.   Specifics of an asymmetric latch were shown via [employer].   Lurline Hare [facility] 09/25/2021, 9:18 AM

## 2021-09-25 NOTE — Lactation Note (Signed)
This note was copied from a baby's chart. Lactation Consultation Note  Patient Name: Meghan Mitchell EVOJJ'K Date: 09/25/2021 Reason for consult: Follow-up assessment;Primapara;Mother's request;Term Age:21 hours  Mom with uncomfortable latch. Nipple shields started by RN did not help with discomfort. I assisted in latching. Infant latched with ease using the teacup hold and frequent swallows are audible to the naked ear. Mom found feeding at the breast "tolerable" (she rated it a 6/7 out of 10) after the initial discomfort subsided.   Infant's latch looked beautiful with a wide gape. I explained to Dad how to do suck training prior to latch to help extend tongue. Infant may be humping tongue as he learns to accommodate Mom's flow of colostrum.    Lurline Hare [facility] 09/25/2021, 11:34 AM

## 2021-09-25 NOTE — Discharge Summary (Signed)
Postpartum Discharge Summary  Date of Service updated     Patient Name: Jessicamarie Amiri DOB: Dec 24, 2000 MRN: [id number]  Date of admission: 09/24/2021 Delivery date:09/25/2021  Delivering provider: WHITE, Ubaldo Glassing D  Date of discharge: 09/26/2021  Admitting diagnosis: Labor and delivery indication for care or intervention [O75.9] Intrauterine pregnancy: [redacted]w[redacted]d    Secondary diagnosis:  Principal Problem:   Labor and delivery indication for care or intervention  [facility]   Discharge diagnosis: Term Pregnancy Delivered                                              Post partum procedures: None Augmentation: AROM Complications: None  Hospital course: Onset of Labor With Vaginal Delivery      20y.o. yo G1P0 at 328w4das admitted in Latent Labor on 09/24/2021. Patient had an uncomplicated labor course as follows:  Membrane Rupture Time/Date: 3:30 AM ,09/25/2021   Delivery Method:Vaginal, Spontaneous  Episiotomy: None  Lacerations:  None  Patient had an uncomplicated postpartum course.  She is ambulating, tolerating a regular diet, passing flatus, and urinating well. Patient is discharged home in stable condition on 09/26/21.  Newborn Data: Birth date:09/25/2021  Birth time:5:33 AM  Gender:Female  Living status:Living  Apgars:8 ,9  Weight:3260 g   Magnesium Sulfate received: No BMZ received: No Rhophylac:N/A MMR:N/A T-DaP:Given prenatally Flu: Yes Transfusion:No  Physical exam  Vitals:   09/25/21 1430 09/25/21 1830 09/25/21 2323 09/26/21 0556  BP: (!) 106/58 109/68 116/62 114/74  Pulse: 92 85 (!) 107 94  Resp: _0 Temp: 98.9 F (37.2 C) 98.6 F (37 C) 98 F (36.7 C) 98.2 F (36.8 C)  TempSrc:   Oral Oral  SpO2:      Weight:      Height:       General: alert, cooperative, and no distress Lochia: appropriate Uterine Fundus: firm Incision: N/A DVT Evaluation: No evidence of DVT seen on physical exam. No cords or  calf tenderness. Labs: Lab Results  Component Value Date   WBC 25.1 (H) 09/24/2021   HGB 11.1 (L) 09/24/2021   HCT 33.9 (L) 09/24/2021   MCV 82.9 09/24/2021   PLT 324 09/24/2021   No flowsheet data found. Edinburgh Score: No flowsheet data found.   After visit meds:  Allergies as of 09/26/2021   No Known Allergies      Medication List     STOP taking these medications    Blood Pressure Monitor Misc   calcium carbonate 500 MG chewable tablet Commonly known as: TUMS - dosed in mg elemental calcium   cyclobenzaprine 10 MG tablet Commonly known as: FLEXERIL       TAKE these medications    acetaminophen 325 MG tablet Commonly known as: Tylenol Take 2 tablets (650 mg total) by mouth every 4 (four) hours as needed (for pain scale < 4). What changed:  when to take this reasons to take this   ibuprofen 600 MG tablet Commonly known as: ADVIL Take 1 tablet (600 mg total) by mouth every 6 (six) hours.   norethindrone 0.35 MG tablet Commonly known as: MICRONOR Take 1 tablet (0.35 mg total) by mouth daily.   prenatal multivitamin Tabs tablet Take 1 tablet by mouth daily at 12 noon. What changed: when to take this  Discharge home in stable condition Infant Feeding: Breast Infant Disposition:home with mother Discharge instruction: per After Visit Summary and Postpartum booklet. Activity: Advance as tolerated. Pelvic rest for 6 weeks.  Diet: routine diet Future Appointments: Future Appointments  Date Time Provider [place]  09/30/2021 10:30 AM [facility] NURSE [facility]   Follow up Visit:  Follow-up Information     [facility] Follow up in 6 week(s).   Specialty: Obstetrics and Gynecology Contact information: [address] [place] [phone]                 Please schedule this patient for a In person postpartum visit in 6 weeks with the following provider: Any  provider. Additional Postpartum F/U:  Low risk pregnancy complicated by:  Delivery mode:  Vaginal, Spontaneous  Anticipated Birth Control:  POPs   09/26/2021 Radene Gunning, MD

## 2021-09-26 MED ORDER — ACETAMINOPHEN 325 MG PO TABS: 650.0000 mg | ORAL_TABLET | ORAL | Status: DC | PRN

## 2021-09-26 MED ORDER — IBUPROFEN 600 MG PO TABS: 600.0000 mg | ORAL_TABLET | Freq: Four times a day (QID) | ORAL | 0 refills | Status: DC

## 2021-09-26 MED ORDER — PRENATAL MULTIVITAMIN CH: 1.0000 | ORAL_TABLET | Freq: Every day | ORAL | Status: DC

## 2021-09-26 MED ORDER — NORETHINDRONE 0.35 MG PO TABS: 1.0000 | ORAL_TABLET | Freq: Every day | ORAL | 11 refills | Status: DC

## 2021-09-26 NOTE — Lactation Note (Signed)
This note was copied from a baby's chart. Lactation Consultation Note  Patient Name: Meghan Mitchell [id number] Date: 09/26/2021 Reason for consult: Follow-up assessment;RN request;Nipple pain/trauma Age:21 hours  Mom has compression striped on left nipple but states she is less sore today.  Slight bruising noted on right nipple as well. Mom Felt [facility] consult yesterday with [facility]. Was very helpful.    Baby in nursery getting Circ. At time of consult today.  LC answered questions and reviewed basics and information regarding engorgement prevention.    Mom has brochure and is aware of resources.  LC encouraged OP LC appt. At the med center with [facility].  Mom lives in [place] but plans on attending [facility] Tuesday.    Maternal Data Has patient been taught Hand Expression?: Yes  Feeding Mother's Current Feeding Choice: Breast Milk  LATCH Score Latch: Grasps breast easily, tongue down, lips flanged, rhythmical sucking.  Audible Swallowing: A few with stimulation  Type of Nipple: Everted at rest and after stimulation  Comfort (Breast/Nipple): Filling, red/small blisters or bruises, mild/mod discomfort  Hold (Positioning): Assistance needed to correctly position infant at breast and maintain latch.  LATCH Score: 7   Lactation Tools Discussed/Used Tools: Pump;Flanges Flange Size: 21;24 Breast pump type: Manual Pump Education: Setup, frequency, and cleaning Reason for Pumping: provided prior to DC for everting nipple/ pre pumping if needed  Interventions Interventions: Education  Discharge Discharge Education: Engorgement and breast care;Warning signs for feeding baby Pump: Personal (medela wearable and evenflo DEBP)  Consult Status Consult Status: Complete Date: 09/19/21 Follow-up type: In-patient    Maryruth Hancock [facility] 09/26/2021, 12:45 PM

## 2021-09-26 NOTE — Clinical Social Work Maternal (Signed)
°CLINICAL SOCIAL WORK MATERNAL/CHILD NOTE ° °Patient Details  °Name: Meghan Mitchell °MRN: [id number] °Date of Birth: 04/14/2001 ° °Date:  09/26/2021 ° °Clinical Social Worker Initiating Note:  Emerie Vanderkolk, MSW, LCSWA Date/Time: Initiated:  09/26/21/1349    ° °Child's Name:  Meghan Mitchell  ° °Biological Parents:  Mother, Father  ° °Need for Interpreter:  None  ° °Reason for Referral:  Current Substance Use/Substance Use During Pregnancy    ° °FOB's grandmother Address:  [address] °[place] [address]  °  °FOB's mother Address: [address] °[place], [place] [address] ° °Phone number:  [phone] (home)    ° °Additional phone number: [phone] Nasir Mitchell (FOB) ° °Household Members/Support Persons (HM/SP):   Household Member/Support Person 1 (FOB's grandmother) ° ° °HM/SP Name Relationship DOB or Age  °HM/SP -1 Nasir Mitchell FOB 04/26/01  °HM/SP -2        °HM/SP -3        °HM/SP -4        °HM/SP -5        °HM/SP -6        °HM/SP -7        °HM/SP -8        ° ° °Natural Supports (not living in the home):  Spouse/significant other, Extended Family, Immediate Family  ° °Professional Supports: None  ° °Employment: Part-time  ° °Type of Work: [employer]  ° °Education:  Some College  ° °Homebound arranged:   ° °Financial Resources:  Medicaid  ° °Other Resources:  Food Stamps  , WIC  ° °Cultural/Religious Considerations Which May Impact Care:   ° °Strengths:  Ability to meet basic needs  , Home prepared for child  , Pediatrician chosen  ° °Psychotropic Medications:        ° °Pediatrician:     ([facility]) ° °Pediatrician List:  ° °[place]    °[place]    °[place]    °[place]    °[place]    °[place]    ° ° °Pediatrician Fax Number:   ° °Risk Factors/Current Problems:  Substance Use    ° °Cognitive State:  Able to Concentrate  , Alert  , Insightful  , Linear Thinking    ° °Mood/Affect:  Comfortable  , Interested  , Calm  , Happy  , Bright  , Relaxed    ° °CSW  Assessment: CSW met with MOB to complete consult for substance use during pregnancy. CSW observed MOB resting in bed breastfeeding infant, FOB sitting on couch, and MGM eating while sitting in recliner. MOB gave CSW verbal consent to complete consult while FOB, and MGM was present. CSW explained role, and reason for consult. MOB was pleasant, and polite during engagement with CSW. MOB denied any history of mental health, psychotropic medication, and therapy involvement. MOB declined mental health resources.  ° °MOB reported, history of THC, and her last use was a week ago. MOB reported, her duration of use has been one to two years. MOB reported, her reason for use was for sleep, and appetite. MOB denied any additional illicit substances, and CPS involvement. CSW inform MOB of drug screen policy, and MOB was understanding of protocol. CSW made CPS report to [place] with Beretta Nunnally. Per Nunnally, CPS there are barriers to d/c until cleared from CPS.   ° °CSW provided education regarding the baby blues period vs. perinatal mood disorders, discussed treatment and gave resources for mental health follow up if concerns arise. CSW   recommends self- evaluation during the postpartum time period using the New Mom Checklist from Postpartum Progress and encouraged MOB to contact a medical professional if symptoms are noted at any time.  ° °MOB reported, since delivery she feels, "overwhelm, but okay". MOB reported, FOB, and her family are very supportive. MOB denied SI, and HI when CSW assessed for safety.  ° °MOB reported, she receives WIC, and food stamps. MOB reported, infant's pediatrician will be at [facility]. MOB reported, there are no transportation barriers to follow up infant's care. MOB reported, she has all essentials needed to care for infant. MOB reported, infant has a car seat, and pack n' play. MOB denied any additional barriers.   °  °CSW provided education on sudden infant death syndrome  (SIDS). ° °CSW made CPS report to [place] with Beretta Nunnally. Per Nunnally, CPS there are barriers to d/c until cleared from CPS.   ° °Beretta Nunnally # [phone] ° °CSW Plan/Description:  Sudden Infant Death Syndrome (SIDS) Education, Perinatal Mood and Anxiety Disorder (PMADs) Education, Child Protective Service Report  , CSW Will Continue to Monitor Umbilical Cord Tissue Drug Screen Results and Make Report if Warranted, Hospital Drug Screen Policy Information  ° °Jerald Villalona, MSW, LCSW-A °Clinical Social Worker- Weekends °[phone]  ° ° °Jacquan Savas, LCSWA °09/26/2021, 1:55 PM °

## 2021-09-30 ENCOUNTER — Other Ambulatory Visit: Payer: Medicaid Other

## 2021-10-07 ENCOUNTER — Telehealth (HOSPITAL_COMMUNITY): Payer: Self-pay

## 2021-10-07 NOTE — Telephone Encounter (Signed)
"  I'm doing good. It was a hard adjustment at first but now we are doing good. I think I'm healing pretty good." Patient declines questions or concerns about her healing.  "He is great. Eating and gaining weight well. He sleeps in a pack n play." RN reviewed ABC's of safe sleep with patient. Patient declines any questions or concerns about baby.  EPDS score is 1.  Sharyn Lull [facility] 10/07/2021,1753

## 2021-10-27 ENCOUNTER — Ambulatory Visit: Payer: Medicaid Other | Admitting: Advanced Practice Midwife

## 2022-02-23 ENCOUNTER — Encounter: Payer: Self-pay | Admitting: Obstetrics & Gynecology

## 2022-02-23 ENCOUNTER — Ambulatory Visit: Payer: Medicaid Other | Admitting: Obstetrics & Gynecology

## 2022-02-23 VITALS — BP 127/74 | HR 77 | Ht 66.0 in | Wt 150.8 lb

## 2022-02-23 DIAGNOSIS — Z30013 Encounter for initial prescription of injectable contraceptive: Secondary | ICD-10-CM

## 2022-02-23 DIAGNOSIS — Z3202 Encounter for pregnancy test, result negative: Secondary | ICD-10-CM | POA: Diagnosis not present

## 2022-02-23 LAB — POCT URINE PREGNANCY: Preg Test, Ur: NEGATIVE

## 2022-02-23 MED ORDER — MEDROXYPROGESTERONE ACETATE 150 MG/ML IM SUSP: 150.0000 mg | INTRAMUSCULAR | 4 refills | Status: DC

## 2022-02-23 MED ORDER — MEDROXYPROGESTERONE ACETATE 150 MG/ML IM SUSP
150.0000 mg | Freq: Once | INTRAMUSCULAR | Status: AC
  Administered 2022-02-23: 150 mg via INTRAMUSCULAR

## 2022-02-23 NOTE — Progress Notes (Signed)
   GYN VISIT Patient name: Meghan Mitchell MRN [id number]  Date of birth: [birth year] Chief Complaint:   Follow-up (On birth control pills; sometimes forgets to take pill; interested in Depo)  History of Present Illness:   Meghan Mitchell is a 21 y.o. G38P1001 female being seen today for the following concerns:  Currently on POPs, but she sometimes forgets to take her pill and wishes to change to something else.  She has done some reading on her own and leading towards Depot  She has not had a regular period since childbirth- no longer breastfeeding  .     No LMP recorded.     07/01/2021    8:52 AM 05/05/2021   11:40 AM  Depression screen PHQ 2/9  Decreased Interest 0 0  Down, Depressed, Hopeless 0 0  PHQ - 2 Score 0 0  Altered sleeping 0 1  Tired, decreased energy 0 1  Change in appetite 0 1  Feeling bad or failure about yourself  0 0  Trouble concentrating 0 0  Moving slowly or fidgety/restless 0 0  Suicidal thoughts 0 0  PHQ-9 Score 0 3     Review of Systems:   Pertinent items are noted in HPI Denies fever/chills, dizziness, headaches, visual disturbances, fatigue, shortness of breath, chest pain, abdominal pain, vomiting, Denies problems with bowel movements, urination, or intercourse unless otherwise stated above.  Pertinent History Reviewed:  Reviewed past medical,surgical, social, obstetrical and family history.  Reviewed problem list, medications and allergies. Physical Assessment:   Vitals:   02/23/22 1534  BP: 127/74  Pulse: 77  Weight: 150 lb 12.8 oz (68.4 kg)  Height: 5\' 6"  (1.676 m)  Body mass index is 24.34 kg/m.       Physical Examination:   General appearance: alert, well appearing, and in no distress  Psych: mood appropriate, normal affect  Skin: warm & dry   Cardiovascular: normal heart rate noted  Respiratory: normal respiratory effort, no distress  Abdomen: soft, non-tender   Pelvic: examination not indicated  Extremities: no edema, no calf  tenderness bilaterally  Chaperone: N/A    Assessment & Plan:  1) Contraceptive management -reviewed all options -discussed risk/benefit of each -plan for Depot, Rx sent in   Orders Placed This Encounter  Procedures   POCT urine pregnancy    Return for 3 mos Depo, annual in Nov with Depo.   Dec, DO Attending Obstetrician & Gynecologist, [facility] for [facility]

## 2022-04-22 DIAGNOSIS — F4320 Adjustment disorder, unspecified: Secondary | ICD-10-CM | POA: Diagnosis not present

## 2022-04-28 DIAGNOSIS — F4320 Adjustment disorder, unspecified: Secondary | ICD-10-CM | POA: Diagnosis not present

## 2022-05-03 DIAGNOSIS — F4320 Adjustment disorder, unspecified: Secondary | ICD-10-CM | POA: Diagnosis not present

## 2022-05-10 DIAGNOSIS — F4320 Adjustment disorder, unspecified: Secondary | ICD-10-CM | POA: Diagnosis not present

## 2022-05-17 DIAGNOSIS — F4320 Adjustment disorder, unspecified: Secondary | ICD-10-CM | POA: Diagnosis not present

## 2022-05-18 ENCOUNTER — Ambulatory Visit (INDEPENDENT_AMBULATORY_CARE_PROVIDER_SITE_OTHER): Payer: Medicaid Other | Admitting: *Deleted

## 2022-05-18 DIAGNOSIS — Z3042 Encounter for surveillance of injectable contraceptive: Secondary | ICD-10-CM | POA: Diagnosis not present

## 2022-05-18 MED ORDER — MEDROXYPROGESTERONE ACETATE 150 MG/ML IM SUSP
150.0000 mg | Freq: Once | INTRAMUSCULAR | Status: AC
  Administered 2022-05-18: 150 mg via INTRAMUSCULAR

## 2022-05-18 NOTE — Progress Notes (Signed)
   NURSE VISIT- INJECTION  SUBJECTIVE:  Meghan Mitchell is a 21 y.o. G70P1001 female here for a Depo Provera for contraception/period management. She is a GYN patient.   OBJECTIVE:  There were no vitals taken for this visit.  Appears well, in no apparent distress  Injection administered in: Left deltoid  Meds ordered this encounter  Medications   medroxyPROGESTERone (DEPO-PROVERA) injection 150 mg    ASSESSMENT: GYN patient Depo Provera for contraception/period management PLAN: Follow-up: in 11-13 weeks for next Depo   Malachy Mood  05/18/2022 9:44 AM

## 2022-05-24 DIAGNOSIS — F4320 Adjustment disorder, unspecified: Secondary | ICD-10-CM | POA: Diagnosis not present

## 2022-06-14 DIAGNOSIS — F4320 Adjustment disorder, unspecified: Secondary | ICD-10-CM | POA: Diagnosis not present

## 2022-06-21 DIAGNOSIS — F4320 Adjustment disorder, unspecified: Secondary | ICD-10-CM | POA: Diagnosis not present

## 2022-06-28 DIAGNOSIS — F4320 Adjustment disorder, unspecified: Secondary | ICD-10-CM | POA: Diagnosis not present

## 2022-07-05 DIAGNOSIS — F4320 Adjustment disorder, unspecified: Secondary | ICD-10-CM | POA: Diagnosis not present

## 2022-07-12 DIAGNOSIS — F4320 Adjustment disorder, unspecified: Secondary | ICD-10-CM | POA: Diagnosis not present

## 2022-07-19 DIAGNOSIS — F4320 Adjustment disorder, unspecified: Secondary | ICD-10-CM | POA: Diagnosis not present

## 2022-08-10 ENCOUNTER — Encounter: Payer: Self-pay | Admitting: Adult Health

## 2022-08-10 ENCOUNTER — Other Ambulatory Visit (HOSPITAL_COMMUNITY)
Admission: RE | Admit: 2022-08-10 | Discharge: 2022-08-10 | Disposition: A | Discharge status: Home / Self Care | Payer: Medicaid Other | Source: Ambulatory Visit | Attending: Adult Health | Admitting: Adult Health

## 2022-08-10 ENCOUNTER — Ambulatory Visit (INDEPENDENT_AMBULATORY_CARE_PROVIDER_SITE_OTHER): Payer: Medicaid Other | Admitting: Adult Health

## 2022-08-10 VITALS — BP 110/71 | HR 73 | Ht 66.0 in | Wt 136.0 lb

## 2022-08-10 DIAGNOSIS — N921 Excessive and frequent menstruation with irregular cycle: Secondary | ICD-10-CM | POA: Diagnosis not present

## 2022-08-10 DIAGNOSIS — Z113 Encounter for screening for infections with a predominantly sexual mode of transmission: Secondary | ICD-10-CM

## 2022-08-10 DIAGNOSIS — Z3042 Encounter for surveillance of injectable contraceptive: Secondary | ICD-10-CM

## 2022-08-10 DIAGNOSIS — Z0000 Encounter for general adult medical examination without abnormal findings: Secondary | ICD-10-CM | POA: Diagnosis not present

## 2022-08-10 DIAGNOSIS — Z01419 Encounter for gynecological examination (general) (routine) without abnormal findings: Secondary | ICD-10-CM

## 2022-08-10 MED ORDER — MEDROXYPROGESTERONE ACETATE 150 MG/ML IM SUSP
150.0000 mg | Freq: Once | INTRAMUSCULAR | Status: AC
  Administered 2022-08-10: 150 mg via INTRAMUSCULAR

## 2022-08-10 MED ORDER — MEDROXYPROGESTERONE ACETATE 150 MG/ML IM SUSP: 150.0000 mg | INTRAMUSCULAR | 4 refills | Status: DC

## 2022-08-10 MED ORDER — MEGESTROL ACETATE 40 MG PO TABS: ORAL_TABLET | ORAL | 1 refills | Status: DC

## 2022-08-10 NOTE — Progress Notes (Signed)
Patient ID: Meghan Mitchell, female   DOB: 10-24-2000, 21 y.o.   MRN: [id number] History of Present Illness: Meghan Mitchell is a 21 year old white female,single, G1P1001, in for a well woman gyn exam. And she gets depo today, she had had irregular bleeding, no pain.  Last pap was ASCUS negative HPV 05/05/21.   Current Medications, Allergies, Past Medical History, Past Surgical History, Family History and Social History were reviewed in [employer] record.     Review of Systems: Patient denies any headaches, hearing loss, fatigue, blurred vision, shortness of breath, chest pain, abdominal pain, problems with bowel movements, urination, or intercourse. No joint pain or mood swings.  See HPI for positives   Physical Exam:BP 110/71 (BP Location: Left Arm, Patient Position: Sitting, Cuff Size: Normal)   Pulse 73   Ht 5\' 6"  (1.676 m)   Wt 136 lb (61.7 kg)   Breastfeeding No   BMI 21.95 kg/m   General:  Well developed, well nourished, no acute distress Skin:  Warm and dry Neck:  Midline trachea, normal thyroid, good ROM, no lymphadenopathy Lungs; Clear to auscultation bilaterally Breast:  No dominant palpable mass, retraction, or nipple discharge Cardiovascular: Regular rate and rhythm Abdomen:  Soft, non tender, no hepatosplenomegaly Pelvic:  External genitalia is normal in appearance, no lesions.  The vagina is normal in appearance. Urethra has no lesions or masses. The cervix is bulbous.  Uterus is felt to be normal size, shape, and contour.  No adnexal masses or tenderness noted.Bladder is non tender, no masses felt.CV swab obtained. Rectal:Deferred Extremities/musculoskeletal:  No swelling or varicosities noted, no clubbing or cyanosis Psych:  No mood changes, alert and cooperative,seems happy AA is 1 Fall risk is low    08/10/2022    8:44 AM 07/01/2021    8:52 AM 05/05/2021   11:40 AM  Depression screen PHQ 2/9  Decreased Interest 0 0 0  Down, Depressed, Hopeless 1 0  0  PHQ - 2 Score 1 0 0  Altered sleeping 0 0 1  Tired, decreased energy 1 0 1  Change in appetite 0 0 1  Feeling bad or failure about yourself  0 0 0  Trouble concentrating 0 0 0  Moving slowly or fidgety/restless 0 0 0  Suicidal thoughts 0 0 0  PHQ-9 Score 2 0 3       08/10/2022    8:44 AM 07/01/2021    8:53 AM 05/05/2021   11:42 AM  GAD 7 : Generalized Anxiety Score  Nervous, Anxious, on Edge 0 0 0  Control/stop worrying 0 0 0  Worry too much - different things 0 0 0  Trouble relaxing 0 0 0  Restless 0 0 0  Easily annoyed or irritable 1 0 0  Afraid - awful might happen 0 0 0  Total GAD 7 Score 1 0 0      Upstream - 08/10/22 0843       Pregnancy Intention Screening   Does the patient want to become pregnant in the next year? No    Does the patient's partner want to become pregnant in the next year? No    Would the patient like to discuss contraceptive options today? No      Contraception Wrap Up   Current Method Hormonal Injection    End Method Hormonal Injection             Examination chaperoned by 13/08/23 LPN   Impression and Plan: 1. Encounter for well woman  exam with routine gynecological exam Physical in 1 year Pap in 2025   2. Encounter for surveillance of injectable contraceptive Pt received depo in today, and will refill   3. Irregular intermenstrual bleeding Has irregular bleeding on depo Will rx megace to try to stop  Meds ordered this encounter  Medications   megestrol (MEGACE) 40 MG tablet    Sig: Take 2 daily on days of bleeding    Dispense:  60 tablet    Refill:  1    Order Specific Question:   Supervising Provider    Answer:   Duane Lope H [[id number]]   medroxyPROGESTERone (DEPO-PROVERA) 150 MG/ML injection    Sig: Inject 1 mL (150 mg total) into the muscle every 3 (three) months.    Dispense:  1 mL    Refill:  4    Order Specific Question:   Supervising Provider    Answer:   Duane Lope H [[id number]]   medroxyPROGESTERone  (DEPO-PROVERA) injection 150 mg     4. Screening examination for STD (sexually transmitted disease) CV swab sent for GC/CHL,trich She declines blood labs

## 2022-08-11 LAB — CERVICOVAGINAL ANCILLARY ONLY
Chlamydia: NEGATIVE
Comment: NEGATIVE
Comment: NEGATIVE
Comment: NORMAL
Neisseria Gonorrhea: NEGATIVE
Trichomonas: NEGATIVE

## 2022-08-16 DIAGNOSIS — W268XXA Contact with other sharp object(s), not elsewhere classified, initial encounter: Secondary | ICD-10-CM | POA: Diagnosis not present

## 2022-08-16 DIAGNOSIS — Y92019 Unspecified place in single-family (private) house as the place of occurrence of the external cause: Secondary | ICD-10-CM | POA: Diagnosis not present

## 2022-08-16 DIAGNOSIS — S41111A Laceration without foreign body of right upper arm, initial encounter: Secondary | ICD-10-CM | POA: Diagnosis not present

## 2022-08-31 DIAGNOSIS — R051 Acute cough: Secondary | ICD-10-CM | POA: Diagnosis not present

## 2022-08-31 DIAGNOSIS — J40 Bronchitis, not specified as acute or chronic: Secondary | ICD-10-CM | POA: Diagnosis not present

## 2022-11-02 ENCOUNTER — Ambulatory Visit (INDEPENDENT_AMBULATORY_CARE_PROVIDER_SITE_OTHER): Payer: Medicaid Other | Admitting: *Deleted

## 2022-11-02 VITALS — Ht 66.0 in | Wt 146.0 lb

## 2022-11-02 DIAGNOSIS — Z3042 Encounter for surveillance of injectable contraceptive: Secondary | ICD-10-CM

## 2022-11-02 MED ORDER — MEDROXYPROGESTERONE ACETATE 150 MG/ML IM SUSP
150.0000 mg | Freq: Once | INTRAMUSCULAR | Status: AC
  Administered 2022-11-02: 150 mg via INTRAMUSCULAR

## 2022-11-02 NOTE — Progress Notes (Signed)
   NURSE VISIT- INJECTION  SUBJECTIVE:  Meghan Mitchell is a 22 y.o. G57P1001 female here for a Depo Provera for contraception/period management. She is a GYN patient.   OBJECTIVE:  Ht 5\' 6"  (1.676 m)   Wt 146 lb (66.2 kg)   BMI 23.57 kg/m   Appears well, in no apparent distress  Injection administered in: Right deltoid  Meds ordered this encounter  Medications   medroxyPROGESTERone (DEPO-PROVERA) injection 150 mg    ASSESSMENT: GYN patient Depo Provera for contraception/period management PLAN: Follow-up: in 11-13 weeks for next Depo   Levy Pupa  11/02/2022 9:45 AM

## 2023-01-20 ENCOUNTER — Emergency Department (HOSPITAL_COMMUNITY)
Admission: EM | Admit: 2023-01-20 | Discharge: 2023-01-20 | Disposition: A | Discharge status: Home / Self Care | Payer: Medicaid Other | Attending: Emergency Medicine | Admitting: Emergency Medicine

## 2023-01-20 ENCOUNTER — Encounter (HOSPITAL_COMMUNITY): Payer: Self-pay | Admitting: *Deleted

## 2023-01-20 ENCOUNTER — Other Ambulatory Visit: Payer: Self-pay

## 2023-01-20 ENCOUNTER — Emergency Department (HOSPITAL_COMMUNITY): Payer: Medicaid Other

## 2023-01-20 ENCOUNTER — Ambulatory Visit (INDEPENDENT_AMBULATORY_CARE_PROVIDER_SITE_OTHER): Payer: Medicaid Other | Admitting: *Deleted

## 2023-01-20 DIAGNOSIS — M25571 Pain in right ankle and joints of right foot: Secondary | ICD-10-CM | POA: Diagnosis not present

## 2023-01-20 DIAGNOSIS — M542 Cervicalgia: Secondary | ICD-10-CM | POA: Diagnosis not present

## 2023-01-20 DIAGNOSIS — M545 Low back pain, unspecified: Secondary | ICD-10-CM | POA: Diagnosis not present

## 2023-01-20 DIAGNOSIS — M546 Pain in thoracic spine: Secondary | ICD-10-CM | POA: Insufficient documentation

## 2023-01-20 DIAGNOSIS — Z3042 Encounter for surveillance of injectable contraceptive: Secondary | ICD-10-CM

## 2023-01-20 DIAGNOSIS — W08XXXA Fall from other furniture, initial encounter: Secondary | ICD-10-CM | POA: Diagnosis not present

## 2023-01-20 MED ORDER — MEDROXYPROGESTERONE ACETATE 150 MG/ML IM SUSP
150.0000 mg | Freq: Once | INTRAMUSCULAR | Status: AC
Start: 2023-01-20 — End: 2023-01-20
  Administered 2023-01-20: 150 mg via INTRAMUSCULAR

## 2023-01-20 NOTE — ED Notes (Signed)
ED Provider at bedside. 

## 2023-01-20 NOTE — ED Triage Notes (Signed)
Pt with right ankle pain after her ankle turned and heard a pop sound that occurred yesterday after getting off a stool.

## 2023-01-20 NOTE — ED Provider Notes (Signed)
[facility] EMERGENCY DEPARTMENT AT [facility] Provider Note   CSN: [id number] Arrival date & time: 01/20/23  2952     History  Chief Complaint  Patient presents with   Ankle Pain    Meghan Mitchell is a 22 y.o. female.   Ankle Pain   22 year old female presents emergency department with complaints of right ankle/foot pain.  Patient states that she was stepping down from a stepstool yesterday when she rolled her right ankle inwards.  Reports subsequently falling on her right side.  Has been able to ambulate since then but with right foot/ankle pain.  Denies pain elsewhere from incident.  Denies trauma to head, loss of consciousness, blood thinner use.  Denies any weakness or sensory deficits in affected foot.  Has taken no medications for this.  Denies chest pain, shortness of breath, abdominal pain, nausea, vomiting, urinary symptoms, change in bowel habits.  States she has not had a menstrual cycle since prior birth December 2022 since she has been on contraceptive agent.  No significant pertinent past medical history.  Home Medications Prior to Admission medications   Medication Sig Start Date End Date Taking? Authorizing Provider  medroxyPROGESTERone (DEPO-PROVERA) 150 MG/ML injection Inject 1 mL (150 mg total) into the muscle every 3 (three) months. 08/10/22   Adline Potter, NP  megestrol (MEGACE) 40 MG tablet Take 2 daily on days of bleeding Patient not taking: Reported on 11/02/2022 08/10/22   Adline Potter, NP      Allergies    Patient has no known allergies.    Review of Systems   Review of Systems  All other systems reviewed and are negative.   Physical Exam Updated Vital Signs BP 114/75 (BP Location: Right Arm)   Pulse 60   Temp 98.9 F (37.2 C) (Oral)   Resp 16   Ht  (1.676 m)   Wt 63.5 kg   SpO2 100%   BMI 22.60 kg/m  Physical Exam Vitals and nursing note reviewed.  Constitutional:      General: She is not in acute  distress.    Appearance: She is well-developed.  HENT:     Head: Normocephalic and atraumatic.  Eyes:     Conjunctiva/sclera: Conjunctivae normal.  Cardiovascular:     Rate and Rhythm: Normal rate and regular rhythm.     Heart sounds: No murmur heard. Pulmonary:     Effort: Pulmonary effort is normal. No respiratory distress.     Breath sounds: Normal breath sounds.  Abdominal:     Palpations: Abdomen is soft.     Tenderness: There is no abdominal tenderness.  Musculoskeletal:     Cervical back: Neck supple.     Comments: Patient Tenderness of cervical, thoracic, lumbar spine with no obvious step-off or deformity noted.  No chest wall tenderness.  Patient has full range of motion of bilateral upper lower extremities.  Muscular strength 5 out of 5 upper lower extremities.  Patient with tenderness to palpation of anterior and posterior lateral malleolus as well as an area of ATFL.  No tender palpation of base of fifth metatarsal or medial malleolus.  Patient with full range of motion of ankle in dorsi/plantarflexion.  Pedal pulses 2+ bilaterally.  No sensory deficit distally.  Swelling noted in area of tenderness but no obvious breaks in skin appreciated.  No overlying erythema, palpable fluctuance or induration.  Skin:    General: Skin is warm and dry.     Capillary Refill: Capillary refill takes  less than 2 seconds.  Neurological:     Mental Status: She is alert.  Psychiatric:        Mood and Affect: Mood normal.     ED Results / Procedures / Treatments   Labs (all labs ordered are listed, but only abnormal results are displayed) Labs Reviewed - No data to display  EKG None  Radiology DG Ankle Complete Right  Result Date: 01/20/2023 CLINICAL DATA:  Pain EXAM: RIGHT ANKLE - COMPLETE 3 VIEW; RIGHT FOOT COMPLETE - 3 VIEW COMPARISON:  None Available. FINDINGS: There is no evidence of fracture, dislocation, or joint effusion. There is no evidence of arthropathy or other focal bone  abnormality. Soft tissues are unremarkable. IMPRESSION: No acute osseous abnormality. Electronically Signed   By: Karen Kays M.D.   On: 01/20/2023 11:46   DG Foot Complete Right  Result Date: 01/20/2023 CLINICAL DATA:  Pain EXAM: RIGHT ANKLE - COMPLETE 3 VIEW; RIGHT FOOT COMPLETE - 3 VIEW COMPARISON:  None Available. FINDINGS: There is no evidence of fracture, dislocation, or joint effusion. There is no evidence of arthropathy or other focal bone abnormality. Soft tissues are unremarkable. IMPRESSION: No acute osseous abnormality. Electronically Signed   By: Karen Kays M.D.   On: 01/20/2023 11:46    Procedures Procedures    Medications Ordered in ED Medications - No data to display  ED Course/ Medical Decision Making/ A&P                             Medical Decision Making Amount and/or Complexity of Data Reviewed Radiology: ordered.   This patient presents to the ED for concern of ankle pain, this involves an extensive number of treatment options, and is a complaint that carries with it a high risk of complications and morbidity.  The differential diagnosis includes fracture, strain/sprain, dislocation, ligamentous/tendinous injury, neurovascular compromise, gout, septic arthritis   Co morbidities that complicate the patient evaluation  See HPI   Additional history obtained:  Additional history obtained from EMR External records from outside source obtained and reviewed including hospital records   Lab Tests:  N/a   Imaging Studies ordered:  I ordered imaging studies including right foot/ankle x-ray I independently visualized and interpreted imaging which showed no acute osseous abnormality I agree with the radiologist interpretation   Cardiac Monitoring: / EKG:  The patient was maintained on a cardiac monitor.  I personally viewed and interpreted the cardiac monitored which showed an underlying rhythm of: Sinus rhythm   Consultations  Obtained:  N/a   Problem List / ED Course / Critical interventions / Medication management  Right ankle pain Reevaluation of the patient showed that the patient stayed the same I have reviewed the patients home medicines and have made adjustments as needed   Social Determinants of Health:  Denies tobacco, illicit drug use   Test / Admission - Considered:  Right ankle pain Vitals signs within normal range and stable throughout visit. Imaging studies significant for: See above 22 year old female presents after mechanical fall rolling her right ankle.  Patient without appreciable joint laxity of right ankle with negative x-ray imaging for fracture/dislocation.  Patient without neurovascular compromise.  Symptoms most consistent with right ankle sprain of ATFL.  Patient placed in ASO lace up brace on the emergency department recommend proper fitting athletic shoes, rest, ice, elevation and NSAIDs at home.  Patient recommend follow-up with orthopedics for reassessment.  Treatment plan discussed at length with  patient and she acknowledged understanding was agreeable to said plan. Worrisome signs and symptoms were discussed with the patient, and the patient acknowledged understanding to return to the ED if noticed. Patient was stable upon discharge.          Final Clinical Impression(s) / ED Diagnoses Final diagnoses:  Acute right ankle pain    Rx / DC Orders ED Discharge Orders     None         Peter Garter, [place] 01/20/23 1328    Terrilee Files, MD 01/20/23 [phone]

## 2023-01-20 NOTE — Discharge Instructions (Addendum)
Note the workup today was overall reassuring.  X-ray was negative for any acute fracture or dislocation.  Recommend rest, ice, elevation of affected extremity as well as taking Motrin/ibuprofen as needed for pain.  Recommend follow-up with orthopedics if pain persists.  Please do not hesitate to return to emergency department for worrisome signs and symptoms we discussed become apparent.

## 2023-01-20 NOTE — Progress Notes (Signed)
   NURSE VISIT- INJECTION  SUBJECTIVE:  Meghan Mitchell is a 22 y.o. G42P1001 female here for a Depo Provera for contraception/period management. She is a GYN patient.   OBJECTIVE:  There were no vitals taken for this visit.  Appears well, in no apparent distress  Injection administered in: Left deltoid  Meds ordered this encounter  Medications   medroxyPROGESTERone (DEPO-PROVERA) injection 150 mg    ASSESSMENT: GYN patient Depo Provera for contraception/period management PLAN: Follow-up: in 11-13 weeks for next Depo   Annamarie Dawley  01/20/2023 9:46 AM

## 2023-04-14 ENCOUNTER — Ambulatory Visit (INDEPENDENT_AMBULATORY_CARE_PROVIDER_SITE_OTHER): Payer: Medicaid Other | Admitting: *Deleted

## 2023-04-14 DIAGNOSIS — Z3042 Encounter for surveillance of injectable contraceptive: Secondary | ICD-10-CM

## 2023-04-14 MED ORDER — MEDROXYPROGESTERONE ACETATE 150 MG/ML IM SUSY
150.0000 mg | PREFILLED_SYRINGE | Freq: Once | INTRAMUSCULAR | Status: AC
  Administered 2023-04-14: 150 mg via INTRAMUSCULAR

## 2023-04-14 NOTE — Progress Notes (Signed)
   NURSE VISIT- INJECTION  SUBJECTIVE:  Meghan Mitchell is a 22 y.o. G64P1001 female here for a Depo Provera for contraception/period management. She is a GYN patient.   OBJECTIVE:  There were no vitals taken for this visit.  Appears well, in no apparent distress  Injection administered in: Right deltoid  Meds ordered this encounter  Medications   medroxyPROGESTERone Acetate SUSY 150 mg    ASSESSMENT: GYN patient Depo Provera for contraception/period management PLAN: Follow-up: in 11-13 weeks for next Depo   Malachy Mood  04/14/2023 10:41 AM

## 2023-06-10 IMAGING — CT CT HEAD W/O CM
3 series · 15 of 47 positions shown, 18 images · non-contrast
Comparison: None.

CLINICAL DATA: Headache, sudden, severe, 39 weeks pregnant.

EXAM:
CT HEAD WITHOUT CONTRAST
TECHNIQUE: Contiguous axial images were obtained from the base of the skull
through the vertex without intravenous contrast.

[Series 3: head 5.0 h30s · axial · 0.42mm/px · z∈[-110,+15]mm · 9 of 31 slices shown, 12 images]
[im 3/31  brain]
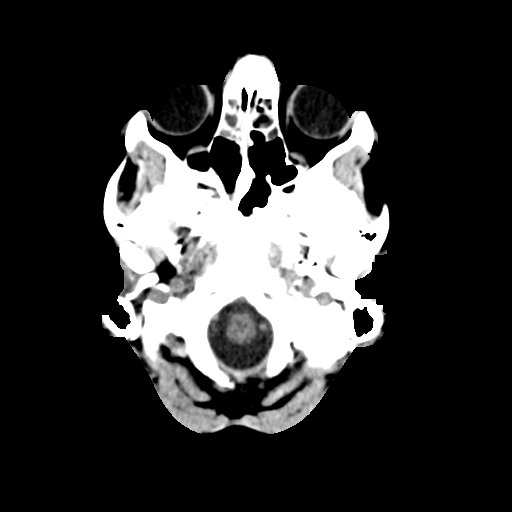
[im 3/31  bone]
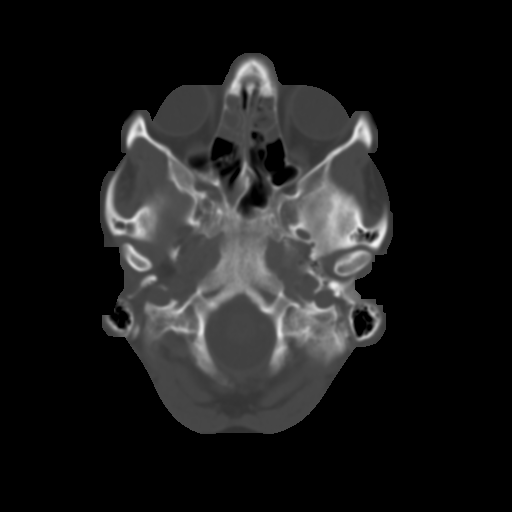
[im 6/31  brain]
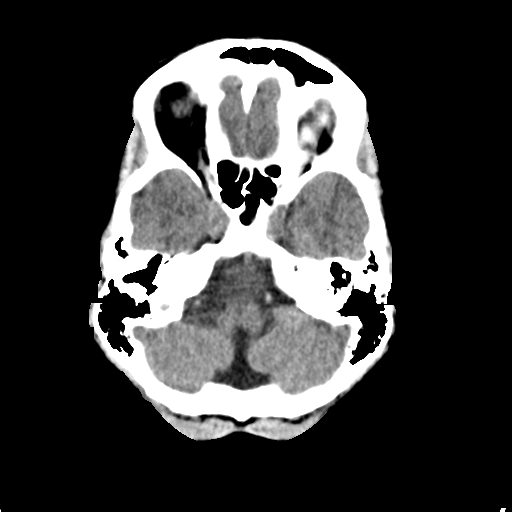
[im 9/31  brain]
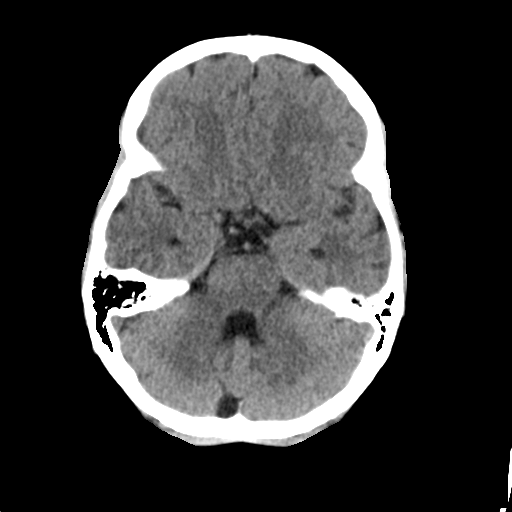
[im 12/31  brain]
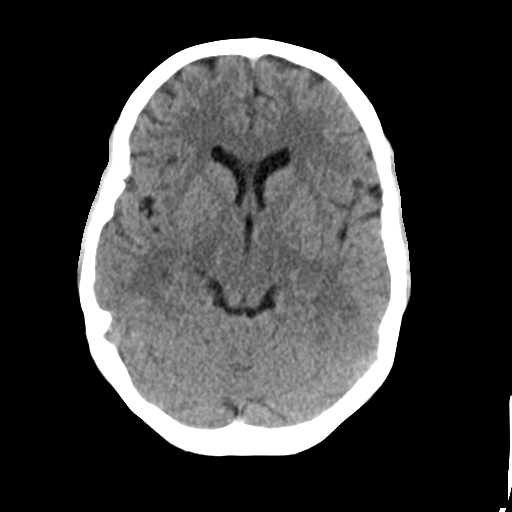
[im 16/31  brain]
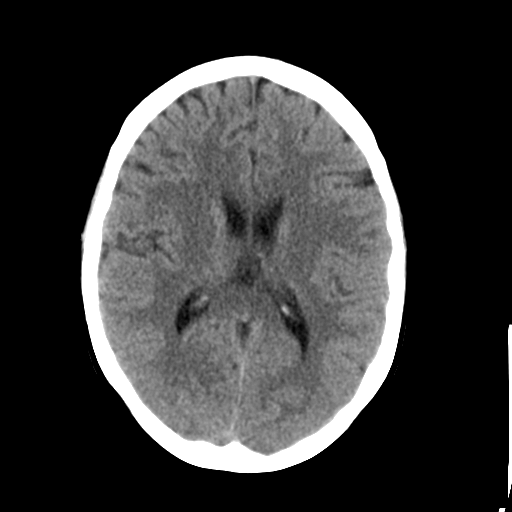
[im 16/31  bone]
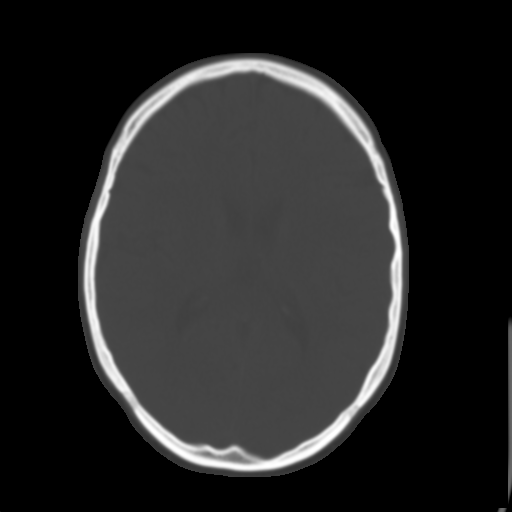
[im 19/31  brain]
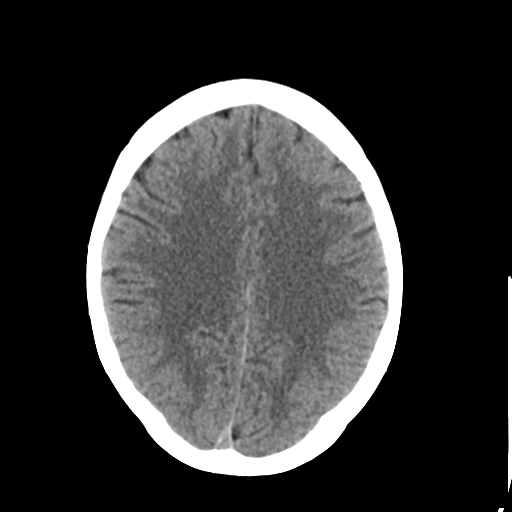
[im 22/31  brain]
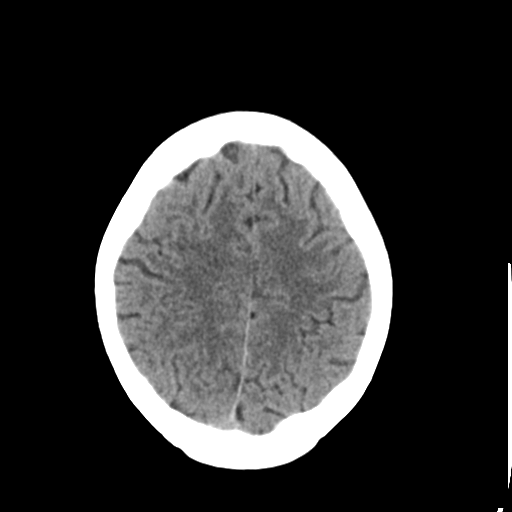
[im 25/31  brain]
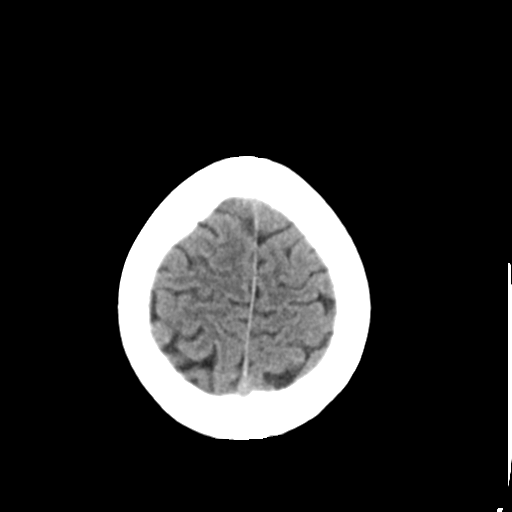
[im 28/31  brain]
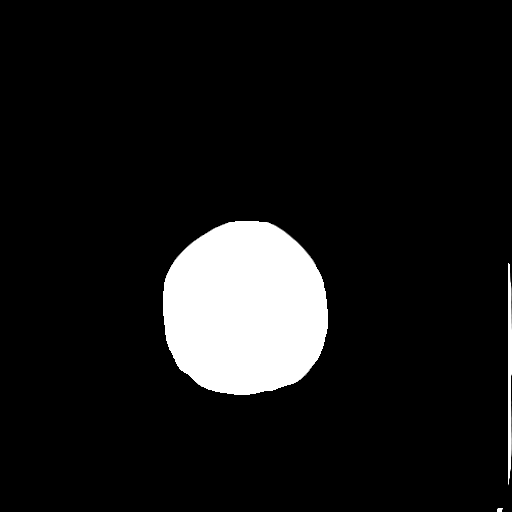
[im 28/31  bone]
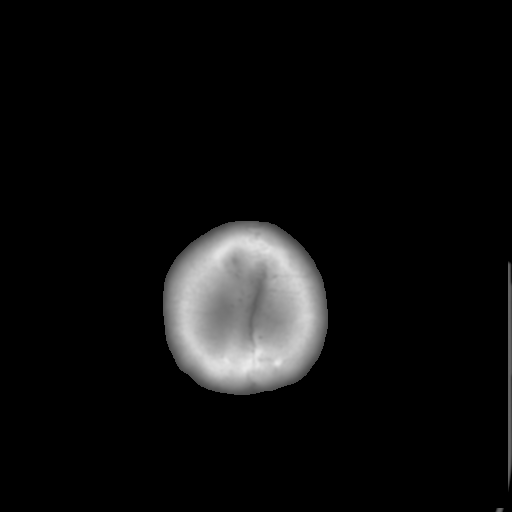

[Series 5: head 3.0 mpr cor · coronal · 0.29mm/px · 3 of 70 slices shown]
[im 24/70  brain]
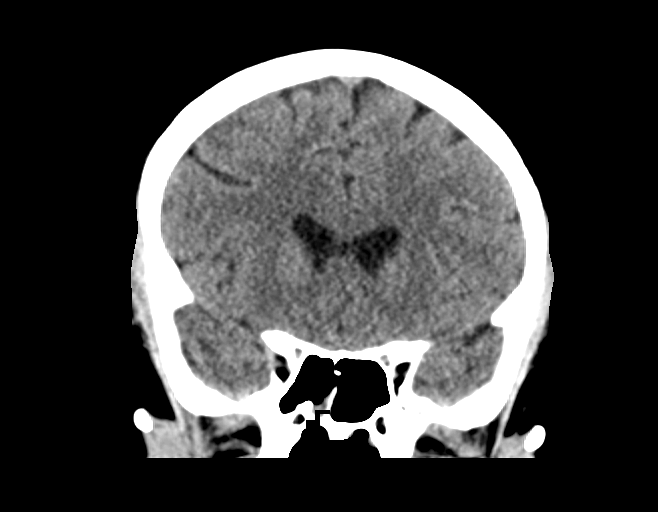
[im 31/70  brain]
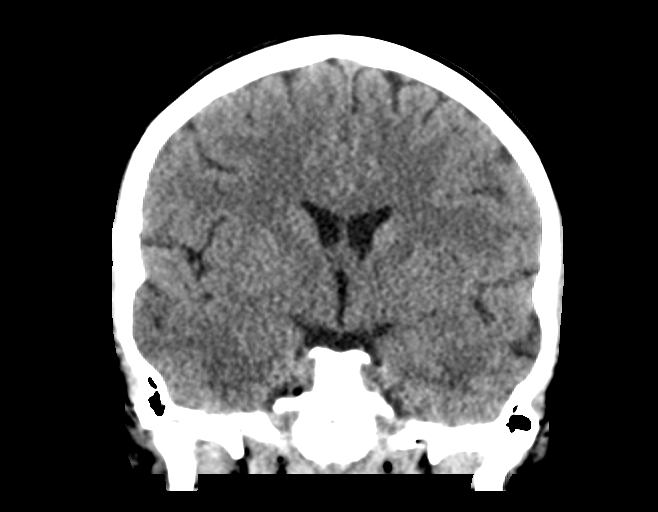
[im 39/70  brain]
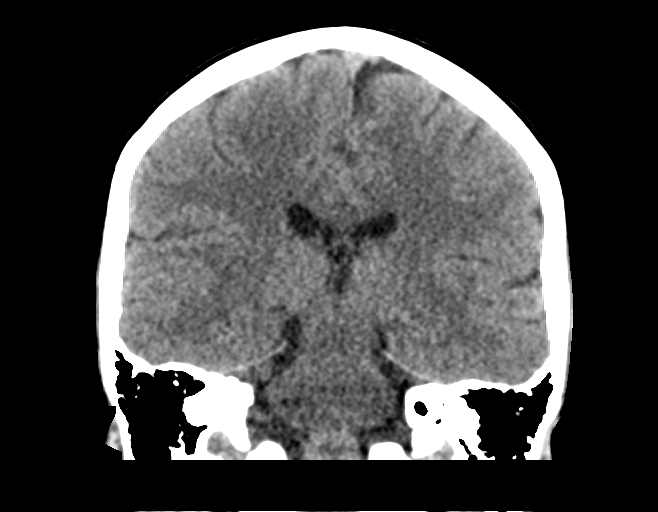

[Series 6: head 3.0 mpr sag · sagittal · 0.29mm/px · 3 of 67 slices shown]
[im 23/67  brain]
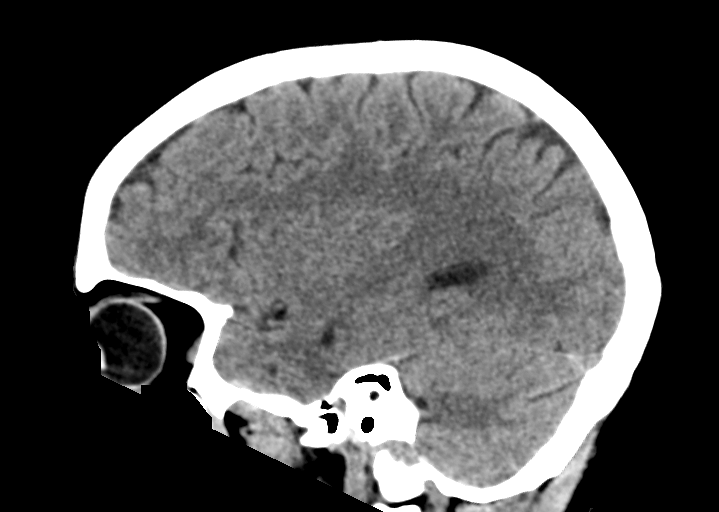
[im 34/67  brain]
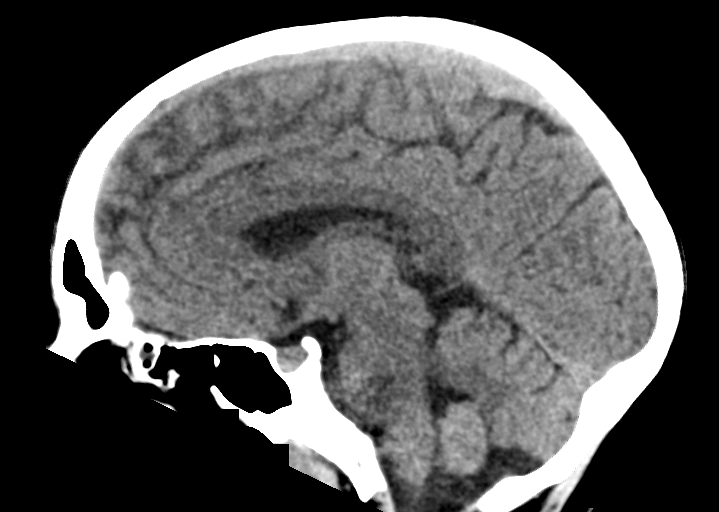
[im 45/67  brain]
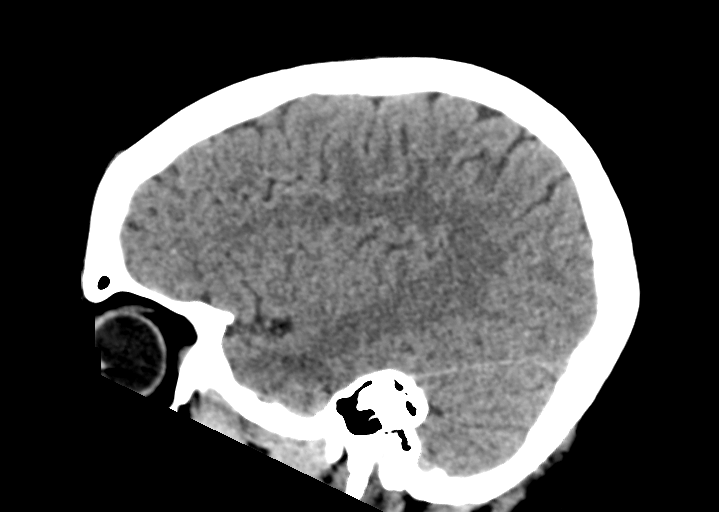

[15 of 47 positions shown; findings below may reference images not displayed]

FINDINGS: Brain: No acute intracranial hemorrhage, midline shift or mass
effect. No extra-axial fluid collection. Gray-white matter
differentiation is within normal limits and there is no
hydrocephalus.

Vascular: No hyperdense vessel or unexpected calcification.

Skull: Normal. Negative for fracture or focal lesion.

Sinuses/Orbits: There is partial opacification of the ethmoid air
cells and maxillary sinuses bilaterally and sphenoid sinus on the
right. No acute orbital abnormality.

Other: None.
IMPRESSION: No acute intracranial process.

## 2023-07-07 ENCOUNTER — Ambulatory Visit (INDEPENDENT_AMBULATORY_CARE_PROVIDER_SITE_OTHER): Payer: Medicaid Other | Admitting: *Deleted

## 2023-07-07 ENCOUNTER — Telehealth: Payer: Self-pay | Admitting: *Deleted

## 2023-07-07 DIAGNOSIS — Z3042 Encounter for surveillance of injectable contraceptive: Secondary | ICD-10-CM

## 2023-07-07 MED ORDER — MEDROXYPROGESTERONE ACETATE 150 MG/ML IM SUSY
150.0000 mg | PREFILLED_SYRINGE | Freq: Once | INTRAMUSCULAR | Status: AC
  Administered 2023-07-07: 150 mg via INTRAMUSCULAR

## 2023-07-07 MED ORDER — MEDROXYPROGESTERONE ACETATE 150 MG/ML IM SUSP: 150.0000 mg | INTRAMUSCULAR | 4 refills | Status: AC

## 2023-07-07 NOTE — Progress Notes (Signed)
   NURSE VISIT- INJECTION  SUBJECTIVE:  Meghan Mitchell is a 22 y.o. G20P1001 female here for a Depo Provera for contraception/period management. She is a GYN patient.   OBJECTIVE:  There were no vitals taken for this visit.  Appears well, in no apparent distress  Injection administered in: Left deltoid  Meds ordered this encounter  Medications   medroxyPROGESTERone Acetate SUSY 150 mg    ASSESSMENT: GYN patient Depo Provera for contraception/period management PLAN: Follow-up: in 11-13 weeks for next Depo   Malachy Mood  07/07/2023 10:30 AM

## 2023-07-07 NOTE — Telephone Encounter (Signed)
Pt needs refills on Depo. Thanks! JSY

## 2023-07-07 NOTE — Telephone Encounter (Signed)
Refilled depo 

## 2023-10-02 ENCOUNTER — Ambulatory Visit: Payer: Medicaid Other
